# Patient Record
Sex: Male | Born: 1955 | Race: White | Hispanic: No | Marital: Married | Smoking: Former smoker
Health system: Southern US, Community
[De-identification: ages and names within clinical notes are randomized; demographics above are authoritative.]

## PROBLEM LIST (undated history)

## (undated) DIAGNOSIS — Z8619 Personal history of other infectious and parasitic diseases: Secondary | ICD-10-CM

## (undated) DIAGNOSIS — F32A Depression, unspecified: Secondary | ICD-10-CM

## (undated) DIAGNOSIS — F1010 Alcohol abuse, uncomplicated: Secondary | ICD-10-CM

## (undated) DIAGNOSIS — F329 Major depressive disorder, single episode, unspecified: Secondary | ICD-10-CM

## (undated) HISTORY — DX: Personal history of other infectious and parasitic diseases: Z86.19

---

## 2011-12-16 ENCOUNTER — Encounter: Payer: Self-pay | Admitting: Internal Medicine

## 2011-12-16 ENCOUNTER — Ambulatory Visit (INDEPENDENT_AMBULATORY_CARE_PROVIDER_SITE_OTHER): Payer: 59 | Admitting: Internal Medicine

## 2011-12-16 VITALS — BP 122/80 | HR 84 | Temp 98.0°F | Ht 62.0 in | Wt 179.0 lb

## 2011-12-16 DIAGNOSIS — N529 Male erectile dysfunction, unspecified: Secondary | ICD-10-CM | POA: Insufficient documentation

## 2011-12-16 DIAGNOSIS — Z125 Encounter for screening for malignant neoplasm of prostate: Secondary | ICD-10-CM

## 2011-12-16 DIAGNOSIS — R6882 Decreased libido: Secondary | ICD-10-CM

## 2011-12-16 DIAGNOSIS — Z0000 Encounter for general adult medical examination without abnormal findings: Secondary | ICD-10-CM

## 2011-12-16 LAB — BASIC METABOLIC PANEL
BUN: 16 mg/dL (ref 6–23)
Chloride: 106 mEq/L (ref 96–112)
Potassium: 4.9 mEq/L (ref 3.5–5.1)
Sodium: 140 mEq/L (ref 135–145)

## 2011-12-16 LAB — CBC WITH DIFFERENTIAL/PLATELET
Basophils Relative: 0.5 % (ref 0.0–3.0)
Eosinophils Relative: 1.8 % (ref 0.0–5.0)
HCT: 45.3 % (ref 39.0–52.0)
Hemoglobin: 14.8 g/dL (ref 13.0–17.0)
Lymphs Abs: 1.9 10*3/uL (ref 0.7–4.0)
MCV: 86.8 fl (ref 78.0–100.0)
Monocytes Absolute: 0.7 10*3/uL (ref 0.1–1.0)
Monocytes Relative: 10.9 % (ref 3.0–12.0)
Neutro Abs: 3.6 10*3/uL (ref 1.4–7.7)
Platelets: 214 10*3/uL (ref 150.0–400.0)
RBC: 5.22 Mil/uL (ref 4.22–5.81)
WBC: 6.2 10*3/uL (ref 4.5–10.5)

## 2011-12-16 LAB — HEPATIC FUNCTION PANEL
ALT: 28 U/L (ref 0–53)
AST: 27 U/L (ref 0–37)
Albumin: 3.7 g/dL (ref 3.5–5.2)
Alkaline Phosphatase: 50 U/L (ref 39–117)
Total Protein: 6.3 g/dL (ref 6.0–8.3)

## 2011-12-16 LAB — T4, FREE: Free T4: 0.76 ng/dL (ref 0.60–1.60)

## 2011-12-16 LAB — LIPID PANEL: Cholesterol: 198 mg/dL (ref 0–200)

## 2011-12-16 LAB — PSA: PSA: 10.5 ng/mL — ABNORMAL HIGH (ref 0.10–4.00)

## 2011-12-16 LAB — IBC PANEL
Iron: 110 ug/dL (ref 42–165)
Saturation Ratios: 32.7 % (ref 20.0–50.0)
Transferrin: 240 mg/dL (ref 212.0–360.0)

## 2011-12-16 MED ORDER — VARDENAFIL HCL 10 MG PO TABS: 10.0000 mg | ORAL_TABLET | Freq: Every day | ORAL | Status: DC | PRN

## 2011-12-16 NOTE — Assessment & Plan Note (Signed)
Refer for screening colonoscopy. Patient declines influenza vaccine. His rectal exam is normal. Check PSA.

## 2011-12-16 NOTE — Progress Notes (Signed)
Subjective:    Patient ID: Brandon Foster, male    DOB: 06-Nov-1955, 56 y.o.   MRN: [id number]  HPI  56 year old white male to establish. He recently moved to [place] from [place]. He is originally from [place] but transferred to [place] because of his job. He works as a Geographical information systems officer. Patient denies any history of chronic illnesses. However over the last one 2 years he has noticed significant drop in his libido and also noticed issues with sexual function. He was not tested for hypogonadism while he was living in [place]. His previous primary care physician had him try Viagra. Patient reports inconsistent response.  His decrease in libido has been gradually worsening ever since reaching his 9s.  Patient complains he is unable to maintain adequate erection. However he still experiences fairly normal nocturnal erections.  Patient denies history of trauma or infection of his testicles. He denies history of liver disease or family history of hemachromatosis.  He currently does not drink. Patient reports drinking more frequently when he was in his 92s. He is also a previous smoker. He smoked in his teens and then for 3 years from 55 to 2001.  He is divorced.  He has 1 son 29 y/o.  He is in [employer] and currently working in [place].  He has been dating someone for last 2-3 months.  Review of Systems   Constitutional: Negative for activity change, appetite change and unexpected weight change.  Eyes: Negative for visual disturbance.  Respiratory: Negative for cough, chest tightness and shortness of breath.   Cardiovascular: Negative for chest pain.  Genitourinary: Negative for difficulty urinating.  Neurological: Negative for headaches.  Gastrointestinal: Negative for abdominal pain, heartburn melena or hematochezia Psych: Negative for depression or anxiety  Past Medical History  Diagnosis Date  . History of chickenpox     History   Social History  . Marital Status: Divorced   Spouse Name: N/A    Number of Children: N/A  . Years of Education: N/A   Occupational History  . Not on file.   Social History Main Topics  . Smoking status: Former Games developer  . Smokeless tobacco: Not on file  . Alcohol Use: No  . Drug Use: No  . Sexually Active: Not on file   Other Topics Concern  . Not on file   Social History Narrative  . No narrative on file    History reviewed. No pertinent past surgical history.  Family History  Problem Relation Age of Onset  . Hypertension Father   . Diabetes Neg Hx     No Known Allergies  Current Outpatient Prescriptions on File Prior to Visit  Medication Sig Dispense Refill  . vardenafil (LEVITRA) 10 MG tablet Take 1 tablet (10 mg total) by mouth daily as needed for erectile dysfunction.  6 tablet  0    BP 122/80  Pulse 84  Temp 98 F (36.7 C) (Oral)  Ht 5\' 2"  (1.575 m)  Wt 179 lb (81.194 kg)  BMI 32.74 kg/m2           Objective:   Physical Exam  Constitutional: He is oriented to person, place, and time. He appears well-developed and well-nourished.  HENT:  Head: Normocephalic and atraumatic.  Right Ear: External ear normal.  Left Ear: External ear normal.  Mouth/Throat: Oropharynx is clear and moist.  Eyes: Conjunctivae normal and EOM are normal. Pupils are equal, round, and reactive to light. No scleral icterus.       No defects  in peripheral vision  Neck: Normal range of motion. Neck supple. No thyromegaly present.       No carotid bruit  Cardiovascular: Normal rate, regular rhythm, normal heart sounds and intact distal pulses.   No murmur heard. Pulmonary/Chest: Effort normal and breath sounds normal. He has no wheezes.  Abdominal: Soft. Bowel sounds are normal. He exhibits no mass. There is no tenderness.  Genitourinary: Rectum normal, prostate normal and penis normal. Guaiac negative stool.  Musculoskeletal: Normal range of motion. He exhibits no edema.       Slight decrease in muscle mass in upper and  lower extremities  Lymphadenopathy:    He has no cervical adenopathy.  Neurological: He is alert and oriented to person, place, and time. No cranial nerve deficit.  Skin: Skin is warm and dry.  Psychiatric: He has a normal mood and affect. His behavior is normal.          Assessment & Plan:

## 2011-12-16 NOTE — Assessment & Plan Note (Signed)
Testicular exam is normal. Rule out hypogonadism. Obtain testosterone and free testosterone levels.

## 2011-12-16 NOTE — Assessment & Plan Note (Signed)
Patient has tried Viagra in the past with incomplete response. Samples of Levitra 10 mg provided. Patient understands he can self titrate to 20 mg if needed.

## 2011-12-17 LAB — TESTOSTERONE, FREE, TOTAL, SHBG
Testosterone-% Free: 1.5 % — ABNORMAL LOW (ref 1.6–2.9)
Testosterone: 427.75 ng/dL (ref 300–890)

## 2011-12-20 NOTE — Progress Notes (Signed)
Quick Note:  Called and left a message for pt to return call; no pharmacy on file- medication could not be sent. ______

## 2011-12-23 ENCOUNTER — Telehealth: Payer: Self-pay

## 2011-12-23 MED ORDER — CIPROFLOXACIN HCL 500 MG PO TABS: 500.0000 mg | ORAL_TABLET | Freq: Two times a day (BID) | ORAL | Status: DC

## 2011-12-23 NOTE — Telephone Encounter (Signed)
Called pt to advise of lab results.  Pt is aware and states he would like Cipro sent to [facility] in [place].  Rx sent.

## 2011-12-31 ENCOUNTER — Encounter: Payer: Self-pay | Admitting: Internal Medicine

## 2011-12-31 ENCOUNTER — Ambulatory Visit (INDEPENDENT_AMBULATORY_CARE_PROVIDER_SITE_OTHER): Payer: 59 | Admitting: Internal Medicine

## 2011-12-31 VITALS — BP 132/80 | HR 72 | Temp 98.3°F | Wt 176.0 lb

## 2011-12-31 DIAGNOSIS — R972 Elevated prostate specific antigen [PSA]: Secondary | ICD-10-CM

## 2011-12-31 DIAGNOSIS — R6882 Decreased libido: Secondary | ICD-10-CM

## 2011-12-31 MED ORDER — TADALAFIL 5 MG PO TABS: ORAL_TABLET | ORAL | Status: DC

## 2011-12-31 NOTE — Assessment & Plan Note (Signed)
Elevated PSA may be secondary to performing test after digital rectal exam. Also consider mild prostatitis. He is finishing course of Cipro 500 mg twice daily for 10 days. Plan repeating PSA in 3 months.  He is against any further treatment even if he has prostate cancer.  I discussed risks at length with patient.   If persistent PSA elevation, I suggest urology consultation.

## 2011-12-31 NOTE — Patient Instructions (Addendum)
Please complete the following lab tests before your next follow up appointment: PSA - 790.93 Estradiol level - 799.81

## 2011-12-31 NOTE — Assessment & Plan Note (Signed)
56 year old white male with decreased libido for follow up.  His testosterone level is normal. I advised against testosterone replacement especially considering his elevated PSA of unclear etiology. He had decent good response to Levitra but would like to try Cialis. Samples of 5 mg provided.

## 2011-12-31 NOTE — Progress Notes (Signed)
  Subjective:    Patient ID: Brandon Foster, male    DOB: 1955-08-17, 56 y.o.   MRN: [id number]  HPI  56 year old white male with history of erectile dysfunction or low libido for followup. Reviewed blood test results. His testosterone level is normal. Patient had decent response to Levitra 10 mg. He would like to try Cialis.  We also discussed elevated PSA of greater than 10.  PSA level was performed after digital rectal exam. He is not having any symptoms of BPH. He denies family history of prostate cancer. He is currently finishing a ten-day course of Cipro 500 mg twice daily. His previous digital rectal exam was normal.  Review of Systems See HPI  Past Medical History  Diagnosis Date  . History of chickenpox     History   Social History  . Marital Status: Divorced    Spouse Name: N/A    Number of Children: N/A  . Years of Education: N/A   Occupational History  . Not on file.   Social History Main Topics  . Smoking status: Former Games developer  . Smokeless tobacco: Not on file  . Alcohol Use: No  . Drug Use: No  . Sexually Active: Not on file   Other Topics Concern  . Not on file   Social History Narrative  . No narrative on file    No past surgical history on file.  Family History  Problem Relation Age of Onset  . Hypertension Father   . Diabetes Neg Hx     No Known Allergies  Current Outpatient Prescriptions on File Prior to Visit  Medication Sig Dispense Refill  . Multiple Vitamin (MULTIVITAMIN) tablet Take 1 tablet by mouth daily.      . ciprofloxacin (CIPRO) 500 MG tablet Take 1 tablet (500 mg total) by mouth 2 (two) times daily.  20 tablet  0  . tadalafil (CIALIS) 5 MG tablet Take 2 to 4 tablets daily as directed  30 tablet  0    BP 132/80  Pulse 72  Temp 98.3 F (36.8 C) (Oral)  Wt 176 lb (79.833 kg)       Objective:   Physical Exam  Constitutional: He is oriented to person, place, and time. He appears well-developed and well-nourished.    Cardiovascular: Normal rate, regular rhythm and normal heart sounds.   Pulmonary/Chest: Breath sounds normal. He has no wheezes.  Genitourinary: Rectum normal and prostate normal.  Neurological: He is alert and oriented to person, place, and time. No cranial nerve deficit.  Skin: Skin is warm and dry.          Assessment & Plan:

## 2012-01-17 ENCOUNTER — Other Ambulatory Visit: Payer: Self-pay | Admitting: Internal Medicine

## 2012-01-17 ENCOUNTER — Telehealth: Payer: Self-pay | Admitting: Internal Medicine

## 2012-01-17 MED ORDER — TADALAFIL 10 MG PO TABS: ORAL_TABLET | ORAL | Status: DC

## 2012-01-20 MED ORDER — TADALAFIL 10 MG PO TABS: ORAL_TABLET | ORAL | Status: DC

## 2012-01-20 NOTE — Telephone Encounter (Signed)
rx for cialis sent to his pharm.

## 2012-01-20 NOTE — Telephone Encounter (Signed)
Pt aware.

## 2012-01-20 NOTE — Addendum Note (Signed)
Addended by: Alfred Levins D on: 01/20/2012 01:38 PM   Modules accepted: Orders

## 2012-03-25 ENCOUNTER — Other Ambulatory Visit (INDEPENDENT_AMBULATORY_CARE_PROVIDER_SITE_OTHER): Payer: 59

## 2012-03-25 DIAGNOSIS — R972 Elevated prostate specific antigen [PSA]: Secondary | ICD-10-CM

## 2012-03-25 DIAGNOSIS — R6882 Decreased libido: Secondary | ICD-10-CM

## 2012-03-31 ENCOUNTER — Encounter: Payer: Self-pay | Admitting: Internal Medicine

## 2012-04-01 ENCOUNTER — Encounter: Payer: Self-pay | Admitting: Internal Medicine

## 2012-04-01 ENCOUNTER — Ambulatory Visit: Payer: 59 | Admitting: Internal Medicine

## 2012-04-02 ENCOUNTER — Encounter: Payer: Self-pay | Admitting: Internal Medicine

## 2012-05-02 ENCOUNTER — Other Ambulatory Visit: Payer: Self-pay

## 2012-08-25 ENCOUNTER — Ambulatory Visit: Payer: 59 | Admitting: Internal Medicine

## 2012-09-01 ENCOUNTER — Ambulatory Visit: Payer: 59 | Admitting: Internal Medicine

## 2012-09-03 ENCOUNTER — Ambulatory Visit (INDEPENDENT_AMBULATORY_CARE_PROVIDER_SITE_OTHER): Payer: 59 | Admitting: Internal Medicine

## 2012-09-03 ENCOUNTER — Encounter: Payer: Self-pay | Admitting: Internal Medicine

## 2012-09-03 VITALS — BP 124/90 | Temp 98.3°F | Wt 188.0 lb

## 2012-09-03 DIAGNOSIS — R972 Elevated prostate specific antigen [PSA]: Secondary | ICD-10-CM

## 2012-09-03 DIAGNOSIS — F4321 Adjustment disorder with depressed mood: Secondary | ICD-10-CM

## 2012-09-03 DIAGNOSIS — Z23 Encounter for immunization: Secondary | ICD-10-CM

## 2012-09-03 MED ORDER — LAMOTRIGINE 25 MG PO TABS: ORAL_TABLET | ORAL | Status: DC

## 2012-09-03 NOTE — Patient Instructions (Addendum)
Please complete the following lab tests before your next follow up appointment: PSA - 790.93

## 2012-09-03 NOTE — Assessment & Plan Note (Signed)
Patient reports exacerbation of adjustment disorder with depressed mood. He was initially diagnosed in 2009. He had poor response to SSRIs and Wellbutrin. He reports taking Lamictal for 3-6 months. He did not experience any side effects including rash. He understands risks of taking Lamictal.  Trial of low dose 25- 50 mg.  Reassess in 1 month.  He will also consider counseling.

## 2012-09-03 NOTE — Assessment & Plan Note (Signed)
Patient again declines referral to urologist for evaluation of elevated PSA. Discussed possibility of prostate cancer.  He understands risks which may include death.  Continue monitoring PSA.

## 2012-09-03 NOTE — Progress Notes (Signed)
  Subjective:    Patient ID: Brandon Foster, male    DOB: September 22, 1955, 57 y.o.   MRN: [id number]  HPI  57 year old white male complains of depressive symptoms. He has history of adjustment disorder with depressed mood since 2009. He was treated in the past by PCP in [place]. He reports poor response with SSRIs and Wellbutrin. He was tried on Lamictal for 3-6 months with improvement.  His initial depressive symptoms triggered by "nasty" divorce. Patient reports poor sleep quality, irritability, and lack of motivation.  Elevated PSA - Patient still declines referral to urologist for elevated PSA.  Review of Systems He tolerated Lamictal the past without difficulty (no report of rash)    Past Medical History  Diagnosis Date  . History of chickenpox     History   Social History  . Marital Status: Divorced    Spouse Name: N/A    Number of Children: N/A  . Years of Education: N/A   Occupational History  . Not on file.   Social History Main Topics  . Smoking status: Former Games developer  . Smokeless tobacco: Not on file  . Alcohol Use: No  . Drug Use: No  . Sexually Active: Not on file   Other Topics Concern  . Not on file   Social History Narrative  . No narrative on file    No past surgical history on file.  Family History  Problem Relation Age of Onset  . Hypertension Father   . Diabetes Neg Hx     No Known Allergies  Current Outpatient Prescriptions on File Prior to Visit  Medication Sig Dispense Refill  . Multiple Vitamin (MULTIVITAMIN) tablet Take 1 tablet by mouth daily.      . tadalafil (CIALIS) 10 MG tablet Use as directed  90 tablet  1   No current facility-administered medications on file prior to visit.    BP 124/90  Temp(Src) 98.3 F (36.8 C) (Oral)  Wt 188 lb (85.276 kg)  BMI 34.38 kg/m2    Objective:   Physical Exam  Constitutional: He is oriented to person, place, and time. He appears well-developed and well-nourished.  Cardiovascular: Normal rate,  regular rhythm and normal heart sounds.   No murmur heard. Pulmonary/Chest: Effort normal and breath sounds normal. He has no wheezes.  Neurological: He is alert and oriented to person, place, and time. No cranial nerve deficit.  Psychiatric: He has a normal mood and affect. His behavior is normal.          Assessment & Plan:

## 2012-09-28 ENCOUNTER — Other Ambulatory Visit: Payer: 59

## 2012-10-01 ENCOUNTER — Ambulatory Visit: Payer: 59 | Admitting: Internal Medicine

## 2012-12-04 ENCOUNTER — Telehealth: Payer: Self-pay | Admitting: Internal Medicine

## 2012-12-04 NOTE — Telephone Encounter (Signed)
Please clarify message.  What is he requesting?

## 2012-12-04 NOTE — Telephone Encounter (Signed)
Pt called back and stated that he needs 90, not 88. Please assist.

## 2012-12-04 NOTE — Telephone Encounter (Signed)
Ok for 90 day supply of Cialis.  I don't think his ins co will approve 90 pills, but he can have rx with 3 RF

## 2012-12-04 NOTE — Telephone Encounter (Signed)
Patient is requesting a prescription for Cialis 10 mg, #90 -

## 2012-12-04 NOTE — Telephone Encounter (Signed)
Pt request refill of tadalafil (CIALIS) 10 MG tablet But his supplier has changed the packaging and needs 88. (Comes in packs of 4) Pt will pick up script.

## 2012-12-07 MED ORDER — TADALAFIL 10 MG PO TABS: ORAL_TABLET | ORAL | Status: DC

## 2012-12-07 NOTE — Telephone Encounter (Signed)
Rx ready for pick up and patient is aware 

## 2013-01-21 ENCOUNTER — Other Ambulatory Visit: Payer: Self-pay

## 2013-12-17 ENCOUNTER — Telehealth: Payer: Self-pay | Admitting: Internal Medicine

## 2013-12-17 NOTE — Telephone Encounter (Signed)
Pt needs OV 

## 2013-12-17 NOTE — Telephone Encounter (Signed)
Pt request refill of the following: tadalafil (CIALIS) 10 MG tablet    Phamacy:

## 2013-12-20 NOTE — Telephone Encounter (Signed)
Pt will cb to sch °

## 2014-11-15 ENCOUNTER — Encounter: Payer: Self-pay | Admitting: Adult Health

## 2014-11-15 ENCOUNTER — Ambulatory Visit (INDEPENDENT_AMBULATORY_CARE_PROVIDER_SITE_OTHER): Payer: Managed Care, Other (non HMO) | Admitting: Adult Health

## 2014-11-15 VITALS — BP 126/80 | Temp 97.8°F | Ht 62.0 in | Wt 186.0 lb

## 2014-11-15 DIAGNOSIS — Z760 Encounter for issue of repeat prescription: Secondary | ICD-10-CM | POA: Diagnosis not present

## 2014-11-15 NOTE — Progress Notes (Signed)
Pre visit review using our clinic review tool, if applicable. No additional management support is needed unless otherwise documented below in the visit note. 

## 2014-11-15 NOTE — Progress Notes (Signed)
   Subjective:    Patient ID: Brandon Foster, male    DOB: [birth year], 59 y.o.   MRN: [id number]  HPI  59 year old male who presents to the office for refill of his Cialis. He has not had a physical since 2013. His PSA has been elevated in the past and he has always refused Urology referral. He understands the risks. He is unwilling to have a physical, his last one was in 2013. He understands the risks of not having yearly physical as well. He states " I know I am healthy"     Review of Systems  Genitourinary: Negative.        Erectile dysfunction and BPH  All other systems reviewed and are negative.  Past Medical History  Diagnosis Date  . History of chickenpox     Social History   Social History  . Marital Status: Divorced    Spouse Name: N/A  . Number of Children: N/A  . Years of Education: N/A   Occupational History  . Not on file.   Social History Main Topics  . Smoking status: Former Games developer  . Smokeless tobacco: Not on file  . Alcohol Use: No  . Drug Use: No  . Sexual Activity: Not on file   Other Topics Concern  . Not on file   Social History Narrative    No past surgical history on file.  Family History  Problem Relation Age of Onset  . Hypertension Father   . Diabetes Neg Hx     No Known Allergies  Current Outpatient Prescriptions on File Prior to Visit  Medication Sig Dispense Refill  . Multiple Vitamin (MULTIVITAMIN) tablet Take 1 tablet by mouth daily.     No current facility-administered medications on file prior to visit.    BP 126/80 mmHg  Temp(Src) 97.8 F (36.6 C) (Oral)  Ht  (1.575 m)  Wt 186 lb (84.369 kg)  BMI 34.01 kg/m2       Objective:   Physical Exam  Constitutional: He is oriented to person, place, and time. He appears well-developed and well-nourished. No distress.  Neurological: He is alert and oriented to person, place, and time.  Skin: Skin is warm and dry. No rash noted. He is not diaphoretic. No erythema. No  pallor.  Psychiatric: He has a normal mood and affect. His behavior is normal. Judgment and thought content normal.  Nursing note and vitals reviewed.      Assessment & Plan:  1. Medication refill - tadalafil (CIALIS) 10 MG tablet; Use as directed  Dispense: 30 tablet; Refill: 0 - Spoke to pt's PCP ,Dr. Artist Pais. Who advised that he was fine with one month supply, but needs to make an appointment for physical since it has been so long since his last one.   Called patient and informed him that Dr. Artist Pais was ok with one month prescription but that he would have to come in for a physical. Per patient " that is not going to happen. The [facility] pharmacy will only take a 90 day, so don't worry about it. I have a two month supply and I have other resources."

## 2014-11-16 ENCOUNTER — Telehealth: Payer: Self-pay | Admitting: Internal Medicine

## 2014-11-16 ENCOUNTER — Telehealth: Payer: Self-pay | Admitting: Adult Health

## 2014-11-16 MED ORDER — TADALAFIL 10 MG PO TABS: ORAL_TABLET | ORAL | Status: DC

## 2014-11-16 NOTE — Telephone Encounter (Signed)
Pt saw cory yesterday and calling back checking on status of med generic cialis cory was going to talk with dr Artist Pais per pt.

## 2014-11-16 NOTE — Telephone Encounter (Signed)
Spoke to patient on the phone and informed him that Dr. Artist Pais was ok with one month prescription but that he would have to come in for a physical. Per patient "That is not going to happen". He does not want the one month due to cost and needing a 90 day prescription. Per patient " I have a two month supply and I have other resources."

## 2014-11-16 NOTE — Telephone Encounter (Signed)
Left message to call back re: medication refill

## 2016-06-11 ENCOUNTER — Ambulatory Visit (INDEPENDENT_AMBULATORY_CARE_PROVIDER_SITE_OTHER): Payer: Managed Care, Other (non HMO) | Admitting: Physical Medicine and Rehabilitation

## 2016-06-11 ENCOUNTER — Encounter (INDEPENDENT_AMBULATORY_CARE_PROVIDER_SITE_OTHER): Payer: Self-pay | Admitting: Physical Medicine and Rehabilitation

## 2016-06-11 VITALS — BP 151/111 | HR 95

## 2016-06-11 DIAGNOSIS — G8929 Other chronic pain: Secondary | ICD-10-CM

## 2016-06-11 DIAGNOSIS — M542 Cervicalgia: Secondary | ICD-10-CM

## 2016-06-11 DIAGNOSIS — M609 Myositis, unspecified: Secondary | ICD-10-CM

## 2016-06-11 DIAGNOSIS — M545 Low back pain, unspecified: Secondary | ICD-10-CM

## 2016-06-11 DIAGNOSIS — R202 Paresthesia of skin: Secondary | ICD-10-CM | POA: Diagnosis not present

## 2016-06-11 MED ORDER — METHOCARBAMOL 500 MG PO TABS: 500.0000 mg | ORAL_TABLET | Freq: Three times a day (TID) | ORAL | 1 refills | Status: DC | PRN

## 2016-06-11 NOTE — Progress Notes (Signed)
Brandon Foster - 61 y.o. male MRN [id number]  Date of birth: 04/20/1955  Office Visit Note: Visit Date: 06/11/2016 PCP: Brandon Lemonsobert Yoo, DO Referred by: Brandon Foster, Brandon R, DO  Subjective: Chief Complaint  Patient presents with  . Lower Back - Pain  . Neck - Pain   HPI: Mr. Brandon Foster is a pleasant and active 61 year old gentleman who comes in today with history of worsening low back pain starting around December after starting some practice boxing. He tells me that off and on over the years she's been very active in martial arts and physical activities and he's had back pain off and on for sometime early in November and December he began training with someone who was starting to teach him how to box. He says a lot of the practice was on the balls of his feet twisting and turning and jabbing and cooking. He did some work as well the heavy bag with a lot of force. He states that he began having a lot of pain in the lumbosacral junction. He says is probably a little more worse right than left. He began then having some tingling numbness type paresthesias in the right foot more on the bottom of the foot may be the top of the foot. He had no real radiating symptoms down to the foot. He gets some pain in the hip. More on the right but is starting to come on the left. He has had no focal weakness or bowel or bladder symptoms. He's had no fever chills or night sweats. He then began having some neck pain with pain across the shoulders and he'll get radiating symptoms with a paresthesia into the fourth and fifth digit bilaterally. A little bit worse left than right. He states that this neck pain and numbness and tingling actually has been around for many years and just worsened recently. He has never had any workup in terms of imaging such as MRIs or CAT scans. He did have x-rays performed at an urgent care at [facility]. He tells me those were read to him as having some degenerative changes. Unfortunately do not have any  reports or am not able to see the pictures themselves. He states that these were x-rays of the cervical thoracic and lumbar spine.  Again, the symptoms in the feet are intermittent his worst pain as the lower back pain. He has tried Naprosyn taking this 1 over-the-counter Aleve in the morning and at night. He was given Flexeril that made him sleepy but otherwise didn't seem to help that much. He was also given prednisone taper which she didn't take any didn't feel like that helped very much either. He has been seeing a chiropractor for several sessions without much relief. He has seen a chiropractor off and on for years. He has not been to formal physical therapy. He does have a heart history with prior stents. He does not have any other really active medical issues. He does get some worsening in the morning but no frank morning stiffness.    Pain from neck to hips. Pain started after taking boxing lesson about 3 months ago. Neck pain is a little worse on the left. Tingling and numbness in fingers depending on position- bilateral. Also has pain in mid back, across lower back and upper pelvis. Some pain radiating down legs, bilateral buttock and groin pain, numbness in both feet. These symptoms come and go. Seems to get worse after moving around in the morning. [facility]  recently of cervical, thoracic, and lumbar spine. Review of Systems  Constitutional: Negative for chills, fever, malaise/fatigue and weight loss.  HENT: Negative for hearing loss and sinus pain.   Eyes: Negative for blurred vision, double vision and photophobia.  Respiratory: Negative for cough and shortness of breath.   Cardiovascular: Negative for chest pain, palpitations and leg swelling.  Gastrointestinal: Negative for abdominal pain, nausea and vomiting.  Genitourinary: Negative for flank pain.  Musculoskeletal: Positive for back pain and neck pain. Negative for myalgias.  Skin: Negative for itching and rash.    Neurological: Positive for tingling. Negative for tremors, focal weakness and weakness.  Endo/Heme/Allergies: Negative.   Psychiatric/Behavioral: Negative for depression.  All other systems reviewed and are negative.  Otherwise per HPI.  Assessment & Plan: Visit Diagnoses:  1. Chronic bilateral low back pain without sciatica   2. Myofascitis   3. Paresthesia of skin   4. Cervicalgia     Plan: Findings:  Chronic history of off and on low back pain and neck pain with some paresthesia into the fourth and fifth digits bilaterally in the upper extremities. Now with worsening since December of predominantly low back pain after increased activity with learning how to box. He on exam has signs consistent with facet mediated low back pain and trigger points myofascial pain. I am going to have him sign a form so that I can get the x-ray reports from the urgent care. We may ultimately get notes from his chiropractor but not yet. I think at this point the main issue is his low back pain and I think this is a combination of myofascial pain and spondylosis. We are going to send him to physical therapy for a short course a [facility] for manual treatment as well as dry needling. I'm also going increase his Naprosyn which is Aleve over-the-counter to 2 pills twice a day. Will not keep him on that very long. He does have a history of heart stent. I also want to change the muscle relaxer to Robaxin. I think he'll be able to take this during the day without sedation. I'll have him come back in 4 weeks just to see how he is doing. For his neck pain is interesting I think this is again more myofascial. This is been more of a chronic situation and ongoing but nothing that seemingly has limited him. It could be more of a brachial plexus irritation from tight cervical paraspinal musculature as well as tight scalene musculature. Depending on his relief we would look at an MRI of the lumbar spine. I spent  more than 45 minutes speaking face-to-face with the patient with 50% of the time in counseling.    Meds & Orders:  Meds ordered this encounter  Medications  . methocarbamol (ROBAXIN) 500 MG tablet    Sig: Take 1 tablet (500 mg total) by mouth every 8 (eight) hours as needed for muscle spasms.    Dispense:  60 tablet    Refill:  1    Orders Placed This Encounter  Procedures  . Ambulatory referral to Physical Therapy    Follow-up: Return in about 4 weeks (around 07/09/2016).   Procedures: No procedures performed  No notes on file   Clinical History: No specialty comments available.  He reports that he has quit smoking. He has never used smokeless tobacco. No results for input(s): HGBA1C, LABURIC in the last 8760 hours.  Objective:  VS:  HT:    WT:   BMI:  BP:(!) 151/111  HR:95bpm  TEMP: ( )  RESP:  Physical Exam  Constitutional: He is oriented to person, place, and time. He appears well-developed and well-nourished. No distress.  HENT:  Head: Normocephalic and atraumatic.  Nose: Nose normal.  Mouth/Throat: Oropharynx is clear and moist.  Eyes: Conjunctivae are normal. Pupils are equal, round, and reactive to light.  Neck: Normal range of motion. Neck supple. No tracheal deviation present.  Cardiovascular: Regular rhythm and intact distal pulses.   Pulmonary/Chest: Effort normal and breath sounds normal.  Abdominal: Soft. He exhibits no distension.  Musculoskeletal: He exhibits no deformity.  Cervical range of motion is limited in ranges of rotation and just due to some pain. He does have some mild trigger points in the levator scapula and rhomboid. He has mild shoulder impingement signs bilaterally. He has a negative drop arm test bilaterally. Inspection reveals no atrophy of the bilateral APB or FDI or hand intrinsics. There is no swelling, color changes, allodynia or dystrophic changes. There is 5 out of 5 strength in the bilateral wrist extension, finger abduction and  long finger flexion. There is intact sensation to light touch in all dermatomal and peripheral nerve distributions.  There is a negative Tinel's test at the bilateral wrist and elbow. There is a negative Phalen's test bilaterally. There is a negative Hoffmann's test bilaterally.  The patient ambulates without aid with a normal gait.  They have no pain with hip internal or external rotation.  There is concordant pain with extension rotation of the lumbar spine. They have no pain over the greater trochanters or PSIS.  On manual muscle testing there is 5/5 strength in all muscle groups of the lower extremities bilaterally without deficits.  There are 2+ muscle stretch reflexes at the quadriceps and gastrocnemius.  There is no clonus bilaterally.  Slump test is negative bilaterally. There are focal trigger points in the paraspinal musculature at the lumbosacral junction. These reproduces pain as well.   Lymphadenopathy:    He has no cervical adenopathy.  Neurological: He is alert and oriented to person, place, and time.  Skin: Skin is warm. No rash noted.  Psychiatric: He has a normal mood and affect. His behavior is normal.  Nursing note and vitals reviewed.   Ortho Exam Imaging: No results found.  Past Medical/Family/Surgical/Social History: Medications & Allergies reviewed per EMR Patient Active Problem List   Diagnosis Date Noted  . Adjustment disorder with depressed mood 09/03/2012  . Elevated PSA 12/31/2011  . Erectile dysfunction 12/16/2011  . Libido, decreased 12/16/2011  . Preventative health care 12/16/2011   Past Medical History:  Diagnosis Date  . History of chickenpox    Family History  Problem Relation Age of Onset  . Hypertension Father   . Diabetes Neg Hx    History reviewed. No pertinent surgical history. Social History   Occupational History  . Not on file.   Social History Main Topics  . Smoking status: Former Games developer  . Smokeless tobacco: Never Used  .  Alcohol use No  . Drug use: No  . Sexual activity: Not on file

## 2016-06-11 NOTE — Patient Instructions (Signed)
Aleve 2 tabs by mouth 2 times per day until we see you back.

## 2016-07-09 ENCOUNTER — Ambulatory Visit (INDEPENDENT_AMBULATORY_CARE_PROVIDER_SITE_OTHER): Payer: Managed Care, Other (non HMO) | Admitting: Physical Medicine and Rehabilitation

## 2016-07-12 ENCOUNTER — Telehealth (INDEPENDENT_AMBULATORY_CARE_PROVIDER_SITE_OTHER): Payer: Self-pay

## 2016-07-12 NOTE — Telephone Encounter (Signed)
Patient left vm wishing to canc his appt for 07/16/16 due to having to go out of town for work. Stated he will call back to rs.

## 2016-07-16 ENCOUNTER — Ambulatory Visit (INDEPENDENT_AMBULATORY_CARE_PROVIDER_SITE_OTHER): Payer: Managed Care, Other (non HMO) | Admitting: Physical Medicine and Rehabilitation

## 2016-07-25 ENCOUNTER — Ambulatory Visit (INDEPENDENT_AMBULATORY_CARE_PROVIDER_SITE_OTHER): Payer: Managed Care, Other (non HMO) | Admitting: Physical Medicine and Rehabilitation

## 2016-07-25 ENCOUNTER — Encounter (INDEPENDENT_AMBULATORY_CARE_PROVIDER_SITE_OTHER): Payer: Self-pay | Admitting: Physical Medicine and Rehabilitation

## 2016-07-25 VITALS — BP 143/96 | HR 89

## 2016-07-25 DIAGNOSIS — M5116 Intervertebral disc disorders with radiculopathy, lumbar region: Secondary | ICD-10-CM

## 2016-07-25 DIAGNOSIS — M609 Myositis, unspecified: Secondary | ICD-10-CM | POA: Diagnosis not present

## 2016-07-25 DIAGNOSIS — M542 Cervicalgia: Secondary | ICD-10-CM | POA: Diagnosis not present

## 2016-07-25 DIAGNOSIS — G8929 Other chronic pain: Secondary | ICD-10-CM

## 2016-07-25 DIAGNOSIS — M545 Low back pain: Secondary | ICD-10-CM

## 2016-07-25 NOTE — Progress Notes (Deleted)
Has been going to PT and doing core strengthening exercises. Had dry needling which did help some with right lower back muscle tightness. Other than that hasn't noticed much change with pain and reports that it is still lingering. Couldn't tell much difference with muscle relaxer either and has stopped taking. Stil has flare ups of neck pain also. Right now he reports tightness on left side of neck.

## 2016-07-25 NOTE — Progress Notes (Signed)
Brandon Foster - 61 y.o. male MRN [id number]  Date of birth: Sep 13, 1955  Office Visit Note: Visit Date: 07/25/2016 PCP: Patient, No Pcp Per Referred by: Artist PaisYoo, Doe-Hyun R, DO  Subjective: Chief Complaint  Patient presents with  . Lower Back - Pain   HPI: Brandon Foster is a 61 year old gentleman that we saw the end of March with a several month history of chronic worsening low back pain as well as neck pain. He has been to physical therapy for core strengthening and posture work as well as dry needling. He says it did help some with his right lower back and muscle tightness. He stated that the work on the quadratus lumborum help the most. He said he other than that he hasn't really noticed that much in terms of pain relief in his pain is still lingering. He also couldn't tell much with the muscle relaxers always quit taking that. He still has flareups of his neck pain but is not nearly as bad as his low back. He has tightness of the left side of the neck. A lot of his pain complaints are not sharp shooting pain but more lingering type pain. He has had some episodic paresthesias into the hips and legs. He is still just somewhat concerned with his back in general. He has not had any focal weakness. He still exercising. He did have one episode where he was engaging in some sexual activity and has significant back spasms in his back locked up which was very scary for him. He has had again no focal weakness or focal night pain. He's had no unexplained weight loss. Symptoms are better at rest but they linger.    Review of Systems  Constitutional: Negative for chills, fever, malaise/fatigue and weight loss.  HENT: Negative for hearing loss and sinus pain.   Eyes: Negative for blurred vision, double vision and photophobia.  Respiratory: Negative for cough and shortness of breath.   Cardiovascular: Negative for chest pain, palpitations and leg swelling.  Gastrointestinal: Negative for abdominal pain, nausea and  vomiting.  Genitourinary: Negative for flank pain.  Musculoskeletal: Positive for back pain and neck pain. Negative for myalgias.  Skin: Negative for itching and rash.  Neurological: Negative for tremors, focal weakness and weakness.  Endo/Heme/Allergies: Negative.   Psychiatric/Behavioral: Negative for depression.  All other systems reviewed and are negative.  Otherwise per HPI.  Assessment & Plan: Visit Diagnoses:  1. Chronic bilateral low back pain without sciatica   2. Myofascitis   3. Cervicalgia   4. Radiculopathy due to lumbar intervertebral disc disorder     Plan: Findings:  At 1 month follow-up from initial consultation he is still having low back pain is worrisome for him with some referring pattern and some paresthesia although it's really nondermatomal. He has had episodes where it's severe but just mostly lingering pain at times. He is still concerned about his low back is been going on for some time. I do think it would be wise at this point to obtain an MRI of the lumbar spine. He has felt conservative care including medication management and physical therapy and rest and time. He's had no prior back surgery or spinal interventions. His neck pain is still more myofascial. I don't think they worked with him as well on the neck as the low back and therapy and that something I think we need to regroup with at some point. Right now her to look at an MRI of his lumbar spine having back  to review that. Otherwise he should continue his activities and she's been doing. We talked about activity modification he's gotten a good idea that from physical therapy as well. I also will refer him to a massage therapist, Fabiola Backer.    Meds & Orders: No orders of the defined types were placed in this encounter.   Orders Placed This Encounter  Procedures  . MR LUMBAR SPINE WO CONTRAST    Follow-up: Return for MRI review after completion.   Procedures: No procedures performed  No notes on  file   Clinical History: No specialty comments available.  He reports that he has quit smoking. He has never used smokeless tobacco. No results for input(s): HGBA1C, LABURIC in the last 8760 hours.  Objective:  VS:  HT:    WT:   BMI:     BP:(!) 143/96  HR:89bpm  TEMP: ( )  RESP:  Physical Exam  Constitutional: He is oriented to person, place, and time. He appears well-developed and well-nourished. No distress.  HENT:  Head: Normocephalic and atraumatic.  Eyes: Conjunctivae are normal. Pupils are equal, round, and reactive to light.  Neck: Neck supple.  Cardiovascular: Regular rhythm and intact distal pulses.   Pulmonary/Chest: Effort normal. No respiratory distress.  Musculoskeletal:  Cervical range of motion is limited in ranges of extension rotation and forward flexion just due to pain. He has a negative Spurling's test bilaterally. He still has very tight musculature in the trapezius and rhomboid and infraspinatus on the left more than right. He has no shoulder impingement signs. Lumbar spine reviewed shows pain with extension rotation. He has good distal strength and no pain over the PSIS and no pain with hip rotation is no clonus.  Lymphadenopathy:    He has no cervical adenopathy.  Neurological: He is alert and oriented to person, place, and time.  Skin: Skin is warm and dry. No rash noted. No erythema.  Psychiatric: He has a normal mood and affect.  Nursing note and vitals reviewed.   Ortho Exam Imaging: No results found.  Past Medical/Family/Surgical/Social History: Medications & Allergies reviewed per EMR Patient Active Problem List   Diagnosis Date Noted  . Adjustment disorder with depressed mood 09/03/2012  . Elevated PSA 12/31/2011  . Erectile dysfunction 12/16/2011  . Libido, decreased 12/16/2011  . Preventative health care 12/16/2011   Past Medical History:  Diagnosis Date  . History of chickenpox    Family History  Problem Relation Age of Onset  .  Hypertension Father   . Diabetes Neg Hx    History reviewed. No pertinent surgical history. Social History   Occupational History  . Not on file.   Social History Main Topics  . Smoking status: Former Games developer  . Smokeless tobacco: Never Used  . Alcohol use No  . Drug use: No  . Sexual activity: Not on file

## 2016-07-29 ENCOUNTER — Encounter (INDEPENDENT_AMBULATORY_CARE_PROVIDER_SITE_OTHER): Payer: Self-pay | Admitting: Physical Medicine and Rehabilitation

## 2016-08-07 ENCOUNTER — Ambulatory Visit
Admission: RE | Admit: 2016-08-07 | Discharge: 2016-08-07 | Disposition: A | Discharge status: Home / Self Care | Payer: Managed Care, Other (non HMO) | Source: Ambulatory Visit | Attending: Physical Medicine and Rehabilitation | Admitting: Physical Medicine and Rehabilitation

## 2016-08-07 DIAGNOSIS — M5116 Intervertebral disc disorders with radiculopathy, lumbar region: Secondary | ICD-10-CM

## 2016-08-07 DIAGNOSIS — M545 Low back pain: Principal | ICD-10-CM

## 2016-08-07 DIAGNOSIS — G8929 Other chronic pain: Secondary | ICD-10-CM

## 2016-08-08 ENCOUNTER — Encounter (INDEPENDENT_AMBULATORY_CARE_PROVIDER_SITE_OTHER): Payer: Self-pay | Admitting: Physical Medicine and Rehabilitation

## 2016-08-08 ENCOUNTER — Telehealth (INDEPENDENT_AMBULATORY_CARE_PROVIDER_SITE_OTHER): Payer: Self-pay | Admitting: Physical Medicine and Rehabilitation

## 2016-08-08 ENCOUNTER — Ambulatory Visit (INDEPENDENT_AMBULATORY_CARE_PROVIDER_SITE_OTHER): Payer: Managed Care, Other (non HMO) | Admitting: Physical Medicine and Rehabilitation

## 2016-08-08 VITALS — BP 152/111 | HR 100

## 2016-08-08 DIAGNOSIS — G8929 Other chronic pain: Secondary | ICD-10-CM

## 2016-08-08 DIAGNOSIS — M545 Low back pain: Secondary | ICD-10-CM | POA: Diagnosis not present

## 2016-08-08 DIAGNOSIS — M609 Myositis, unspecified: Secondary | ICD-10-CM | POA: Diagnosis not present

## 2016-08-08 NOTE — Progress Notes (Deleted)
Here for MRI review. Reports that the back pain seems to be a little better. Pain is on both sides of lower back. It radiates if under physical or mental stress.

## 2016-08-08 NOTE — Progress Notes (Signed)
Brandon Foster - 61 y.o. male MRN [id number]  Date of birth: 1955/03/24  Office Visit Note: Visit Date: 08/08/2016 PCP: Patient, No Pcp Per Referred by: No ref. provider found  Subjective: Chief Complaint  Patient presents with  . Lower Back - Pain   HPI: Brandon Foster is a 61 year old gentleman that we recently saw for worsening low back pain which can be quite dramatic at times a muscle spasm quality when severe. It does neck him most of the time and is fairly constant. Is worse with increased stress and increased activity. It is mostly worse with standing but can be present standing or sitting. He has no radicular complaints down the legs. No focal weakness. No specific trauma. He's been a pretty physical active guy otherwise. We did obtain an MRI of the lumbar spine which is reviewed below but is essentially normal in this age. There is some degenerative disc changes and some facet arthropathy at L3-4. None of this seems to really fit overall with the severe pain syndrome. He has not had any interventional procedures. I'm a little worried about his high blood pressure that he only seems to have when he comes in. He does not have a primary care physician. He states he keeps his blood pressure at home and is not usually that this may be whitecoat syndrome. He has no chest pain or other symptoms of high blood pressure. He has had physical therapy and now has seen a massage therapist who suggested once a month massage therapy. 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    Review of Systems  Constitutional: Negative for chills, fever, malaise/fatigue and weight loss.  HENT: Negative for hearing loss and sinus pain.   Eyes: Negative for blurred vision, double vision and photophobia.  Respiratory: Negative for cough and shortness of breath.   Cardiovascular: Negative for chest pain, palpitations and leg swelling.  Gastrointestinal: Negative for abdominal pain, nausea and vomiting.  Genitourinary:  Negative for flank pain.  Musculoskeletal: Positive for back pain. Negative for myalgias.  Skin: Negative for itching and rash.  Neurological: Negative for tremors, focal weakness and weakness.  Endo/Heme/Allergies: Negative.   Psychiatric/Behavioral: Negative for depression.  All other systems reviewed and are negative.  Otherwise per HPI.  Assessment & Plan: Visit Diagnoses:  1. Chronic bilateral low back pain without sciatica   2. Myofascitis     Plan: Findings:  Chronic worsening somewhat episodic low back pain with fairly normal appearing MRI for his age with some mild facet arthropathy but with no significant stenosis or focal disc herniations. I don't think his symptoms are facet mediated is by the way he describes the pain in physical exam findings. I do think at this point an epidural injection would be warranted just to see if from a standpoint of his degenerative disc changes is any issue with the disc related annular tear or something similar. He has really failed conservative care including physical therapy as well as massage therapy and medication management. I also have concerns about his hypertension. I think one avenue we can help him with his try to get him a referral to Dr. Gaspar BiddingMichael Rigby. I think Dr. Berline Choughigby could potentially look at osteopathic manipulation but also just further evaluation of his back pain. MRI did show some atrophy of the multifidus I think Dr. Berline Choughigby could probably helping work on that as well. We did talk about exercises today including "bird dog and Supermans".  I will also like Dr. Berline Choughigby  to look at his hypertension and whether he is amenable to seeing him his primary care doctor he can at least rectum in that arena. We will seek approval for diagnostic lumbar epidural injection at L4-5. If he is not helped by that injection that I'm not sure I have much left for him except continued conservative care. I spent more than 20 minutes speaking face-to-face with the  patient with 50% of the time in counseling.    Meds & Orders: No orders of the defined types were placed in this encounter.   Orders Placed This Encounter  Procedures  . AMB referral to sports medicine    Follow-up: Return for L4-5 interlaminar epidural injection..   Procedures: No procedures performed  No notes on file   Clinical History: MRI LUMBAR SPINE WITHOUT CONTRAST 08/07/2016 0  COMPARISON:  None.  FINDINGS: Segmentation:  Standard.  Alignment:  Physiologic.  Vertebrae:  No fracture, evidence of discitis, or bone lesion.  Conus medullaris: Extends to the L1 level and appears normal.  Paraspinal and other soft tissues: Unremarkable.  Disc levels:  L1-L2: Moderate disc space narrowing. Central protrusion. No impingement.  L2-L3: Preserved interspace height. Slight desiccation. Annular bulge. Facet arthropathy. No impingement.  L3-L4: Mild disc space narrowing. Shallow central and leftward protrusion. Posterior element hypertrophy. Subarticular zone narrowing could affect the LEFT L4 nerve root.  L4-L5: Mild disc space narrowing. Central protrusion. Facet arthropathy. No definite impingement.  L5-S1: Preserved interspace height. Slight desiccation. Central protrusion. No impingement.  IMPRESSION: Mild multilevel spondylosis. No significant spinal stenosis or dominant disc extrusion.  He reports that he has quit smoking. He has never used smokeless tobacco. No results for input(s): HGBA1C, LABURIC in the last 8760 hours.  Objective:  VS:  HT:    WT:   BMI:     BP:(!) 152/111  HR:100bpm  TEMP: ( )  RESP:  Physical Exam  Constitutional: He is oriented to person, place, and time. He appears well-developed and well-nourished. No distress.  HENT:  Head: Normocephalic and atraumatic.  Eyes: Conjunctivae are normal. Pupils are equal, round, and reactive to light.  Neck: Normal range of motion. Neck supple.  Cardiovascular: Regular rhythm and  intact distal pulses.   Pulmonary/Chest: Effort normal. No respiratory distress.  Musculoskeletal:  Patient ambulates without aid with a slightly forward flexed spine. He doesn't have temporal pain with extension rotation although he is tight. He does have paraspinal tenderness and tenderness in the quadratus lumborum bilaterally. He has no pain over the greater trochanters. He has no pain with hip rotation. Has good distal strength bilaterally. He has no clonus.  Neurological: He is alert and oriented to person, place, and time. He exhibits normal muscle tone. Coordination normal.  Skin: Skin is warm and dry. No rash noted. No erythema.  Psychiatric: He has a normal mood and affect.  Nursing note and vitals reviewed.   Ortho Exam Imaging: No results found.  Past Medical/Family/Surgical/Social History: Medications & Allergies reviewed per EMR Patient Active Problem List   Diagnosis Date Noted  . Adjustment disorder with depressed mood 09/03/2012  . Elevated PSA 12/31/2011  . Erectile dysfunction 12/16/2011  . Libido, decreased 12/16/2011  . Preventative health care 12/16/2011   Past Medical History:  Diagnosis Date  . History of chickenpox    Family History  Problem Relation Age of Onset  . Hypertension Father   . Diabetes Neg Hx    History reviewed. No pertinent surgical history. Social History   Occupational History  .  Not on file.   Social History Main Topics  . Smoking status: Former Games developer  . Smokeless tobacco: Never Used  . Alcohol use No  . Drug use: No  . Sexual activity: Not on file

## 2016-08-08 NOTE — Telephone Encounter (Signed)
Submitted auth request on [employer] website and it is pending review

## 2016-08-08 NOTE — Telephone Encounter (Signed)
This meant for you?

## 2016-08-13 NOTE — Telephone Encounter (Signed)
Received auth. [id number]. eff 08/26/16-11/24/16. Pt already scheduled.

## 2016-08-26 ENCOUNTER — Ambulatory Visit (INDEPENDENT_AMBULATORY_CARE_PROVIDER_SITE_OTHER): Payer: Self-pay

## 2016-08-26 ENCOUNTER — Encounter (INDEPENDENT_AMBULATORY_CARE_PROVIDER_SITE_OTHER): Payer: Self-pay | Admitting: Physical Medicine and Rehabilitation

## 2016-08-26 ENCOUNTER — Ambulatory Visit (INDEPENDENT_AMBULATORY_CARE_PROVIDER_SITE_OTHER): Payer: Managed Care, Other (non HMO) | Admitting: Physical Medicine and Rehabilitation

## 2016-08-26 VITALS — BP 161/106 | HR 103

## 2016-08-26 DIAGNOSIS — M5116 Intervertebral disc disorders with radiculopathy, lumbar region: Secondary | ICD-10-CM

## 2016-08-26 MED ORDER — LIDOCAINE HCL (PF) 1 % IJ SOLN
2.0000 mL | Freq: Once | INTRAMUSCULAR | Status: AC
  Administered 2016-08-26: 2 mL

## 2016-08-26 MED ORDER — METHYLPREDNISOLONE ACETATE 80 MG/ML IJ SUSP
80.0000 mg | Freq: Once | INTRAMUSCULAR | Status: AC
  Administered 2016-08-26: 80 mg

## 2016-08-26 NOTE — Progress Notes (Deleted)
Patient is here today for planned L4-5 interlaminar injection. No change in symptoms.

## 2016-08-26 NOTE — Patient Instructions (Signed)

## 2016-08-26 NOTE — Procedures (Signed)
Lumbar Epidural Steroid Injection - Interlaminar Approach with Fluoroscopic Guidance  Patient: Brandon Foster      Date of Birth: 1955/09/30 MRN: [id number] PCP: Patient, No Pcp Per      Visit Date: 08/26/2016   Brandon Foster is a 61 year old active gentleman with chronic worsening low back pain which has failed conservative care including chiropractic care and physical therapy as well as anti-inflammatories and muscle relaxers. MRI shows very mild changes of the lumbar spine typical for his age. There is some mild bulging. We are going to complete a diagnostic of therapeutic L4-5 intralaminar epidural steroid injection just to see if he'll give him some relief. He is scheduled to see Dr. Berline Choughigby for evaluation and management as well. Hopefully that may be OMT treatment may help. Otherwise not much more to offer the patient continued ability to stay active and look for ways to improve his core strengthening. He also reports today with vital signs showing increased blood pressure. He tells me that his blood pressure is fine at home and only increases when he comes in to the office. Dr. Berline Choughigby look into this as well.  Physical exam is nonfocal and there is no weakness.  Universal Protocol:    Date/Time: 06/11/183:48 PM  Consent Given By: the patient  Position: PRONE  Additional Comments: Vital signs were monitored before and after the procedure. Patient was prepped and draped in the usual sterile fashion. The correct patient, procedure, and site was verified.   Injection Procedure Details:  Procedure Site One Meds Administered:  Meds ordered this encounter  Medications  . lidocaine (PF) (XYLOCAINE) 1 % injection 2 mL  . methylPREDNISolone acetate (DEPO-MEDROL) injection 80 mg     Laterality: Right  Location/Site:  L4-L5  Needle size: 20 G  Needle type: Tuohy  Needle Placement: Paramedian epidural  Findings:  -Contrast Used: 1 mL iohexol 180 mg iodine/mL   -Comments: Excellent flow of  contrast into the epidural space.  Procedure Details: Using a paramedian approach from the side mentioned above, the region overlying the inferior lamina was localized under fluoroscopic visualization and the soft tissues overlying this structure were infiltrated with 4 ml. of 1% Lidocaine without Epinephrine. The Tuohy needle was inserted into the epidural space using a paramedian approach.   The epidural space was localized using loss of resistance along with lateral and bi-planar fluoroscopic views.  After negative aspirate for air, blood, and CSF, a 2 ml. volume of Isovue-250 was injected into the epidural space and the flow of contrast was observed. Radiographs were obtained for documentation purposes.    The injectate was administered into the level noted above.   Additional Comments:  The patient tolerated the procedure well Dressing: Band-Aid    Post-procedure details: Patient was observed during the procedure. Post-procedure instructions were reviewed.  Patient left the clinic in stable condition.

## 2016-09-02 ENCOUNTER — Ambulatory Visit (INDEPENDENT_AMBULATORY_CARE_PROVIDER_SITE_OTHER): Payer: Managed Care, Other (non HMO) | Admitting: Sports Medicine

## 2016-09-02 ENCOUNTER — Encounter: Payer: Self-pay | Admitting: Sports Medicine

## 2016-09-02 VITALS — BP 138/88 | HR 100 | Ht 73.0 in | Wt 186.4 lb

## 2016-09-02 DIAGNOSIS — M5441 Lumbago with sciatica, right side: Secondary | ICD-10-CM | POA: Diagnosis not present

## 2016-09-02 DIAGNOSIS — M5442 Lumbago with sciatica, left side: Secondary | ICD-10-CM | POA: Diagnosis not present

## 2016-09-02 DIAGNOSIS — M9905 Segmental and somatic dysfunction of pelvic region: Secondary | ICD-10-CM

## 2016-09-02 DIAGNOSIS — R030 Elevated blood-pressure reading, without diagnosis of hypertension: Secondary | ICD-10-CM | POA: Diagnosis not present

## 2016-09-02 MED ORDER — GABAPENTIN 400 MG PO CAPS: ORAL_CAPSULE | ORAL | 2 refills | Status: DC

## 2016-09-02 NOTE — Progress Notes (Signed)
OFFICE VISIT NOTE Brandon Foster. [facility] at [facility] [phone]  Linsey Hirota - 61 y.o. male MRN [id number]  Date of birth: [birth year]  Visit Date: 09/02/2016  PCP: Patient, No Pcp Per   Referred by: Tyrell Antonio, MD  Orlie Dakin, CMA acting as scribe for Dr. Berline Chough.  SUBJECTIVE:   Chief Complaint  Patient presents with  . chronic bilateral low back pain without sciatica   HPI: As below and per problem based documentation when appropriate.  Pt presents today with complaint of chronic bilateral low back pain with sciatica. Pt was referred by Dr. Alvester Morin. He had MRI 08/07/16 which showed the following: IMPRESSION: Mild multilevel spondylosis. No significant spinal stenosis or dominant disc extrusion. Pt also has mid back and neck pain.  Pt was taking a boxing class and suspects this is related to his neck and mid back pain.   The pain is described as aching and is rated as 5/10 on average. The pain is constant but varies in severity.   Worsened with increased stress and increased activity. Pain is also worse when standing.  Improves with laying down on back, side or stomach.  Therapies tried include : Flexeril, Robaxin. He got more relief from Flexeril than Robaxin but the Flexeril affected his productivity at work.   Other associated symptoms include: Pt has shooting tingling pain in his buttock and down into both legs and at times his toes go numb.   Pt denies fever, chills, night sweats, unintentional weight gain or loss.     Review of Systems  Constitutional: Negative for chills and fever.  Respiratory: Negative for shortness of breath and wheezing.   Cardiovascular: Negative for chest pain, palpitations and leg swelling.  Musculoskeletal: Positive for back pain. Negative for falls.  Neurological: Positive for tingling and headaches. Negative for dizziness.  Endo/Heme/Allergies: Does not bruise/bleed easily.   Otherwise per HPI.  HISTORY & PERTINENT PRIOR DATA:  No specialty comments available. He reports that he has quit smoking. He has never used smokeless tobacco. No results for input(s): HGBA1C, LABURIC in the last 8760 hours. Medications & Allergies reviewed per EMR Patient Active Problem List   Diagnosis Date Noted  . Elevated blood pressure reading without diagnosis of hypertension 09/15/2016  . Acute bilateral low back pain with bilateral sciatica 09/02/2016  . Adjustment disorder with depressed mood 09/03/2012  . Elevated PSA 12/31/2011  . Erectile dysfunction 12/16/2011  . Libido, decreased 12/16/2011  . Preventative health care 12/16/2011   Past Medical History:  Diagnosis Date  . History of chickenpox    Family History  Problem Relation Age of Onset  . Hypertension Father   . Diabetes Neg Hx    No past surgical history on file. Social History   Occupational History  . Not on file.   Social History Main Topics  . Smoking status: Former Games developer  . Smokeless tobacco: Never Used  . Alcohol use No  . Drug use: No  . Sexual activity: Not on file    OBJECTIVE:  VS:  HT:6\' 1"  (185.4 cm)   WT:186 lb 6.4 oz (84.6 kg)  BMI:24.6    BP:138/88  HR:100bpm  TEMP: ( )  RESP:97 % EXAM: Findings:  WDWN, NAD, Non-toxic appearing Alert & appropriately interactive Not depressed or anxious appearing No increased work of breathing. Pupils are equal. EOM intact without nystagmus No clubbing or cyanosis of the extremities appreciated No significant rashes/lesions/ulcerations overlying the examined area. Radial  pulses 2+/4.  No significant generalized UE edema. DP & PT pulses 2+/4.  No significant pretibial edema.  No clubbing or cyanosis Sensation intact to light touch in upper and lower extremities.  HEART: RRR, S1/S2 heard, no murmur LUNGS: CTA B  Back & Lower Extremities: Markedly tight straight leg raises but no radicular component. No significant midline tenderness.     Generalized paraspinal muscle tenderness and SI joint TTP without radiation.  Mild pain in the gluteal muscles but no pain no greater sciatic notch. Good internal and external rotation of the hips. Patient is able to heel and toe walk without significant difficulty.  Manual muscle testing is 5+/5 in BLE myotomes without focality Lower extremity DTRs 2+/4 diffusely and symmetric  OSTEOPATHIC/STRUCTURAL EXAM:   Right anterior innominate Markedly tight right so as with overall good lumbar mobility      Xr C-arm No Report  Result Date: 08/26/2016 Please see Notes or Procedures tab for imaging impression.  ASSESSMENT & PLAN:   Problem List Items Addressed This Visit    Acute bilateral low back pain with bilateral sciatica - Primary    PROCEDURE NOTE : OSTEOPATHIC MANIPULATION The decision today to treat with Osteopathic Manipulative Therapy (OMT) was based on physical exam findings. Verbal consent was obtained after after explanation of risks, benefits and potential side effects, including acute pain flare, post manipulation soreness and need for repeat treatments.  If Cervical manipulation was performed additional time was spent discussing the associated minimal risk of  injury to neurovascular structures.  After consent was obtained manipulation was performed as below:            Regions treated:  Per billing codes          Techniques used:  Direct, Muscle Energy, MFR The patient tolerated the treatment well and reported Improved symptoms following treatment today. Patient was given medications, exercises, stretches and lifestyle modifications per AVS and verbally.    HEP including "Foundations Training" Right so as is remarkably tight the point that he was only able to get to approximately 90 hip flexion position prior to engaging a physiologic restriction.  He had to be laid in a lateral decubitus position to be able to adequately stretch this because in a supine position he was already  guarding so much. He has good lumbar mobility which is what is contributing to this being so difficult to ascertain.      Elevated blood pressure reading without diagnosis of hypertension    Patient reports normal blood pressures at home.  Elevated multiple times while seeing Dr. Alvester MorinNewton for back pain and epidural steroid injections.  Likely whitecoat hypertension.  We will have him back home blood pressures several times of the next 2 weeks and will follow-up with him at that time.       Other Visit Diagnoses    Somatic dysfunction of pelvis region          Follow-up: Return in about 2 weeks (around 09/16/2016).   CMA/ATC served as Neurosurgeonscribe during this visit. History, Physical, and Plan performed by medical provider. Documentation and orders reviewed and attested to.      Gaspar BiddingMichael Rigby, DO    [facility]

## 2016-09-02 NOTE — Patient Instructions (Signed)
Also check out the YouTube Video from Dr. Eric Goodman.   "Powerful Posture and Pain Relief: 12 minutes of Foundation Training" - [email or web address].[email or web address]   

## 2016-09-15 DIAGNOSIS — R030 Elevated blood-pressure reading, without diagnosis of hypertension: Secondary | ICD-10-CM | POA: Insufficient documentation

## 2016-09-15 NOTE — Assessment & Plan Note (Addendum)
PROCEDURE NOTE : OSTEOPATHIC MANIPULATION The decision today to treat with Osteopathic Manipulative Therapy (OMT) was based on physical exam findings. Verbal consent was obtained after after explanation of risks, benefits and potential side effects, including acute pain flare, post manipulation soreness and need for repeat treatments.  If Cervical manipulation was performed additional time was spent discussing the associated minimal risk of  injury to neurovascular structures.  After consent was obtained manipulation was performed as below:            Regions treated:  Per billing codes          Techniques used:  Direct, Muscle Energy, MFR The patient tolerated the treatment well and reported Improved symptoms following treatment today. Patient was given medications, exercises, stretches and lifestyle modifications per AVS and verbally.    HEP including "Foundations Training" Right so as is remarkably tight the point that he was only able to get to approximately 90 hip flexion position prior to engaging a physiologic restriction.  He had to be laid in a lateral decubitus position to be able to adequately stretch this because in a supine position he was already guarding so much. He has good lumbar mobility which is what is contributing to this being so difficult to ascertain.

## 2016-09-15 NOTE — Assessment & Plan Note (Signed)
Patient reports normal blood pressures at home.  Elevated multiple times while seeing Dr. Alvester MorinNewton for back pain and epidural steroid injections.  Likely whitecoat hypertension.  We will have Brandon Foster back home blood pressures several times of the next 2 weeks and will follow-up with Brandon Foster at that time.

## 2016-09-16 ENCOUNTER — Ambulatory Visit (INDEPENDENT_AMBULATORY_CARE_PROVIDER_SITE_OTHER): Payer: Managed Care, Other (non HMO) | Admitting: Sports Medicine

## 2016-09-16 ENCOUNTER — Encounter: Payer: Self-pay | Admitting: Sports Medicine

## 2016-09-16 DIAGNOSIS — R030 Elevated blood-pressure reading, without diagnosis of hypertension: Secondary | ICD-10-CM

## 2016-09-16 DIAGNOSIS — M5441 Lumbago with sciatica, right side: Secondary | ICD-10-CM | POA: Diagnosis not present

## 2016-09-16 DIAGNOSIS — M5442 Lumbago with sciatica, left side: Secondary | ICD-10-CM

## 2016-09-16 NOTE — Progress Notes (Signed)
OFFICE VISIT NOTE Brandon Foster. Brandon Foster [facility] [employer] at [facility] [phone]  Brandon Foster - 61 y.o. male MRN [id number]  Date of birth: Jun 04, 1955  Visit Date: 09/16/2016  PCP: Patient, No Pcp Per   Referred by: No ref. provider found  Brandon Foster, CMA acting as scribe for Dr. Berline Chough.  SUBJECTIVE:   Chief Complaint  Patient presents with  . Follow-up    bilateral low back pain with bilateral sciatica   HPI: As below and per problem based documentation when appropriate.  Pt presents today in follow-up of bilateral low back pain with bilateral sciatica. Pain seems to be worse with increased stress or increased physical activity. He has tried taking Flexeril and Robaxin in the past for the pain which has helped. He did note previously that he got more relief with the Flexeril but it affected his productivity at work. He has seen Dr. Alvester Morin and had MRI done 08/07/16 and epidural injection 08/26/16. He was seen last seen in the office 09/02/16 and had OMT. He was prescribed Gabapentin 400 mg to take 1 tablet every morning x 1 week, then increase to twice daily after 2 weeks, then increase to 3 times daily.  Pt has started Gabapentin but it was causing irritability and forgetfulness. He took if for 2 days and then stopped d/t side effects. He has noticed some improvement in low back pain after watching the [employer] exercise video. He doesn't think that he got much relief from the OMT. Overall he reports that he is feeling about the same but slightly better when he does the stretches. No change in the location of the pain. He was taking Aleve but stopped taking that last week. He missed a dose and noticed there wasn't much of a difference in his pain level when he missed it so he decided to stop taking it. He is no longer taking the Flexeril d/t lethargy. He is no longer taking Robaxin because he didn't get much relief and it "shifted" his mind. He has been  icing his back and getting some relief.   Pt denies fever, chills, night sweats.     Review of Systems  Constitutional: Negative for chills and fever.  Respiratory: Negative for shortness of breath and wheezing.   Cardiovascular: Negative for chest pain and palpitations.  Musculoskeletal: Positive for back pain. Negative for falls.  Neurological: Positive for tingling (toes/feet). Negative for dizziness and headaches.  Endo/Heme/Allergies: Does not bruise/bleed easily.    Otherwise per HPI.  HISTORY & PERTINENT PRIOR DATA:  No specialty comments available. He reports that he has quit smoking. He has never used smokeless tobacco. No results for input(s): HGBA1C, LABURIC in the last 8760 hours. Medications & Allergies reviewed per EMR Patient Active Problem List   Diagnosis Date Noted  . Elevated blood pressure reading without diagnosis of hypertension 09/15/2016  . Acute bilateral low back pain with bilateral sciatica 09/02/2016  . Adjustment disorder with depressed mood 09/03/2012  . Elevated PSA 12/31/2011  . Erectile dysfunction 12/16/2011  . Libido, decreased 12/16/2011  . Preventative health care 12/16/2011   Past Medical History:  Diagnosis Date  . History of chickenpox    Family History  Problem Relation Age of Onset  . Hypertension Father   . Diabetes Neg Hx    No past surgical history on file. Social History   Occupational History  . Not on file.   Social History Main Topics  . Smoking status: Former  Smoker  . Smokeless tobacco: Never Used  . Alcohol use No  . Drug use: No  . Sexual activity: Not on file    OBJECTIVE:  VS:  HT:6\' 1"  (185.4 cm)   WT:187 lb 6.4 oz (85 kg)  BMI:24.8    BP:(!) 142/86  HR:(!) 109bpm  TEMP: ( )  RESP:97 % EXAM: Findings:  Adult male.  No acute distress.  Alert and appropriate.  Overall good able go from sit to stand without significant symptoms.  Minimal pain with straight leg raise.       Xr C-arm No Report  Result  Date: 08/26/2016 Please see Notes or Procedures tab for imaging impression.  ASSESSMENT & PLAN:   Problem List Items Addressed This Visit    Acute bilateral low back pain with bilateral sciatica    He had only minimal improvement following osteopathic manipulation last visit this will be deferred today.  He does report good improvement with the foundations exercises however given this is only been 2 weeks he is not having lasting relief at this time.  He has not worked his way up to be able to perform the exercises all the way through the 12 minute video.  Emphasized the importance of trying to perform his exercises intermittently throughout the day as this will help with the neuromuscular dysfunction that does seem to be the underlying issue for him.  Extensive time spent discussing the pathophysiology of his underlying back pain as well as the path of physiology that is resulted in this dysfunction which is likely directly related to his sitting for work.  Patient's greatly appreciative of counseling and greater than 50% of this 30 minute visit was spent in doing so.  Gabapentin was previously not tolerated and he does not wish to try any TCA at this time but can consider adding this for neuromuscular modulation.      Elevated blood pressure reading without diagnosis of hypertension    Mildly elevated today.  He has been diligent about checking his blood pressures at home and reports consistent numbers within the low to mid 130s over the high 80s.  With normal heart rates.  Given the tachycardia that he continues to have a do think white coat hypertension is his main issue especially given normal values.  Initiation of medications is not indicated at this time but should continue with therapeutic lifestyle changes         Follow-up: Return in about 6 weeks (around 10/28/2016).   CMA/ATC served as Neurosurgeonscribe during this visit. History, Physical, and Plan performed by medical provider. Documentation  and orders reviewed and attested to.      Gaspar BiddingMichael Anarie Kalish, DO    [facility]

## 2016-09-17 NOTE — Assessment & Plan Note (Addendum)
He had only minimal improvement following osteopathic manipulation last visit this will be deferred today.  He does report good improvement with the foundations exercises however given this is only been 2 weeks he is not having lasting relief at this time.  He has not worked his way up to be able to perform the exercises all the way through the 12 minute video.  Emphasized the importance of trying to perform his exercises intermittently throughout the day as this will help with the neuromuscular dysfunction that does seem to be the underlying issue for him.  Extensive time spent discussing the pathophysiology of his underlying back pain as well as the path of physiology that is resulted in this dysfunction which is likely directly related to his sitting for work.  Patient's greatly appreciative of counseling and greater than 50% of this 30 minute visit was spent in doing so.  Gabapentin was previously not tolerated and he does not wish to try any TCA at this time but can consider adding this for neuromuscular modulation.

## 2016-09-17 NOTE — Assessment & Plan Note (Signed)
Mildly elevated today.  He has been diligent about checking his blood pressures at home and reports consistent numbers within the low to mid 130s over the high 80s.  With normal heart rates.  Given the tachycardia that he continues to have a do think white coat hypertension is his main issue especially given normal values.  Initiation of medications is not indicated at this time but should continue with therapeutic lifestyle changes

## 2016-10-28 ENCOUNTER — Ambulatory Visit: Payer: Managed Care, Other (non HMO) | Admitting: Sports Medicine

## 2017-08-14 ENCOUNTER — Encounter (HOSPITAL_COMMUNITY): Payer: Self-pay

## 2017-08-14 ENCOUNTER — Other Ambulatory Visit: Payer: Self-pay

## 2017-08-14 ENCOUNTER — Inpatient Hospital Stay (HOSPITAL_COMMUNITY)
Admission: EM | Admit: 2017-08-14 | Discharge: 2017-08-18 | DRG: 881 | Disposition: A | Discharge status: Home / Self Care | Payer: Managed Care, Other (non HMO) | Attending: Internal Medicine | Admitting: Internal Medicine

## 2017-08-14 DIAGNOSIS — F329 Major depressive disorder, single episode, unspecified: Secondary | ICD-10-CM | POA: Diagnosis not present

## 2017-08-14 DIAGNOSIS — Z87891 Personal history of nicotine dependence: Secondary | ICD-10-CM | POA: Diagnosis not present

## 2017-08-14 DIAGNOSIS — Z62810 Personal history of physical and sexual abuse in childhood: Secondary | ICD-10-CM | POA: Diagnosis present

## 2017-08-14 DIAGNOSIS — F1020 Alcohol dependence, uncomplicated: Secondary | ICD-10-CM | POA: Diagnosis not present

## 2017-08-14 DIAGNOSIS — Z7982 Long term (current) use of aspirin: Secondary | ICD-10-CM

## 2017-08-14 DIAGNOSIS — E860 Dehydration: Secondary | ICD-10-CM | POA: Diagnosis present

## 2017-08-14 DIAGNOSIS — F10239 Alcohol dependence with withdrawal, unspecified: Secondary | ICD-10-CM | POA: Diagnosis present

## 2017-08-14 DIAGNOSIS — D751 Secondary polycythemia: Secondary | ICD-10-CM | POA: Diagnosis present

## 2017-08-14 DIAGNOSIS — R45851 Suicidal ideations: Secondary | ICD-10-CM

## 2017-08-14 DIAGNOSIS — F1010 Alcohol abuse, uncomplicated: Secondary | ICD-10-CM | POA: Diagnosis not present

## 2017-08-14 DIAGNOSIS — Z79899 Other long term (current) drug therapy: Secondary | ICD-10-CM | POA: Diagnosis not present

## 2017-08-14 DIAGNOSIS — Z818 Family history of other mental and behavioral disorders: Secondary | ICD-10-CM | POA: Diagnosis not present

## 2017-08-14 DIAGNOSIS — F10232 Alcohol dependence with withdrawal with perceptual disturbance: Secondary | ICD-10-CM | POA: Diagnosis not present

## 2017-08-14 DIAGNOSIS — F10939 Alcohol use, unspecified with withdrawal, unspecified: Secondary | ICD-10-CM | POA: Diagnosis present

## 2017-08-14 DIAGNOSIS — Z811 Family history of alcohol abuse and dependence: Secondary | ICD-10-CM | POA: Diagnosis not present

## 2017-08-14 DIAGNOSIS — F32A Depression, unspecified: Secondary | ICD-10-CM

## 2017-08-14 HISTORY — DX: Depression, unspecified: F32.A

## 2017-08-14 HISTORY — DX: Major depressive disorder, single episode, unspecified: F32.9

## 2017-08-14 HISTORY — DX: Alcohol abuse, uncomplicated: F10.10

## 2017-08-14 LAB — CBC
HCT: 55.5 % — ABNORMAL HIGH (ref 39.0–52.0)
Hemoglobin: 19.8 g/dL — ABNORMAL HIGH (ref 13.0–17.0)
MCH: 30.4 pg (ref 26.0–34.0)
MCHC: 35.7 g/dL (ref 30.0–36.0)
MCV: 85.1 fL (ref 78.0–100.0)
PLATELETS: 245 10*3/uL (ref 150–400)
RBC: 6.52 MIL/uL — ABNORMAL HIGH (ref 4.22–5.81)
RDW: 14.4 % (ref 11.5–15.5)
WBC: 10.3 10*3/uL (ref 4.0–10.5)

## 2017-08-14 LAB — COMPREHENSIVE METABOLIC PANEL
ALT: 26 U/L (ref 17–63)
ANION GAP: 13 (ref 5–15)
AST: 31 U/L (ref 15–41)
Albumin: 4.1 g/dL (ref 3.5–5.0)
Alkaline Phosphatase: 49 U/L (ref 38–126)
BUN: 7 mg/dL (ref 6–20)
CALCIUM: 8.9 mg/dL (ref 8.9–10.3)
CHLORIDE: 102 mmol/L (ref 101–111)
CO2: 23 mmol/L (ref 22–32)
Creatinine, Ser: 0.98 mg/dL (ref 0.61–1.24)
GFR calc non Af Amer: >60 mL/min (ref >60)
Glucose, Bld: 100 mg/dL — ABNORMAL HIGH (ref 65–99)
Potassium: 4.2 mmol/L (ref 3.5–5.1)
SODIUM: 138 mmol/L (ref 135–145)
Total Bilirubin: 0.8 mg/dL (ref 0.3–1.2)
Total Protein: 7.1 g/dL (ref 6.5–8.1)

## 2017-08-14 LAB — ETHANOL: Alcohol, Ethyl (B): 191 mg/dL — ABNORMAL HIGH (ref <10)

## 2017-08-14 LAB — RAPID URINE DRUG SCREEN, HOSP PERFORMED
AMPHETAMINES: NOT DETECTED
Barbiturates: NOT DETECTED
Benzodiazepines: NOT DETECTED
Cocaine: NOT DETECTED
OPIATES: NOT DETECTED
Tetrahydrocannabinol: POSITIVE — AB

## 2017-08-14 LAB — PROTIME-INR
INR: 0.97
PROTHROMBIN TIME: 12.8 s (ref 11.4–15.2)

## 2017-08-14 MED ORDER — FAMOTIDINE 20 MG PO TABS
20.0000 mg | ORAL_TABLET | Freq: Every day | ORAL | Status: DC
  Administered 2017-08-15 – 2017-08-17 (×4): 20 mg via ORAL
  Filled 2017-08-14 (×5): qty 1

## 2017-08-14 MED ORDER — LORAZEPAM 2 MG/ML IJ SOLN
0.0000 mg | Freq: Two times a day (BID) | INTRAMUSCULAR | Status: DC
  Administered 2017-08-16: 2 mg via INTRAVENOUS
  Filled 2017-08-14: qty 1
  Filled 2017-08-14: qty 2

## 2017-08-14 MED ORDER — FOLIC ACID 1 MG PO TABS
1.0000 mg | ORAL_TABLET | Freq: Every day | ORAL | Status: DC
  Administered 2017-08-15 – 2017-08-18 (×4): 1 mg via ORAL
  Filled 2017-08-14 (×4): qty 1

## 2017-08-14 MED ORDER — ENOXAPARIN SODIUM 40 MG/0.4ML ~~LOC~~ SOLN
40.0000 mg | SUBCUTANEOUS | Status: DC
  Administered 2017-08-14 – 2017-08-17 (×4): 40 mg via SUBCUTANEOUS
  Filled 2017-08-14 (×4): qty 0.4

## 2017-08-14 MED ORDER — ADULT MULTIVITAMIN W/MINERALS CH
1.0000 | ORAL_TABLET | Freq: Every day | ORAL | Status: DC
  Administered 2017-08-15 – 2017-08-18 (×4): 1 via ORAL
  Filled 2017-08-14 (×4): qty 1

## 2017-08-14 MED ORDER — IBUPROFEN 200 MG PO TABS: 400.0000 mg | ORAL_TABLET | Freq: Four times a day (QID) | ORAL | Status: DC | PRN

## 2017-08-14 MED ORDER — LORAZEPAM 2 MG/ML IJ SOLN
0.0000 mg | Freq: Four times a day (QID) | INTRAMUSCULAR | Status: DC
  Administered 2017-08-14: 2 mg via INTRAVENOUS
  Filled 2017-08-14 (×2): qty 1

## 2017-08-14 MED ORDER — VITAMIN B-1 100 MG PO TABS
100.0000 mg | ORAL_TABLET | Freq: Every day | ORAL | Status: DC
  Administered 2017-08-15 – 2017-08-18 (×4): 100 mg via ORAL
  Filled 2017-08-14 (×4): qty 1

## 2017-08-14 MED ORDER — LORAZEPAM 2 MG/ML IJ SOLN: 0.0000 mg | Freq: Two times a day (BID) | INTRAMUSCULAR | Status: DC

## 2017-08-14 MED ORDER — SODIUM CHLORIDE 0.9 % IV BOLUS
1000.0000 mL | Freq: Once | INTRAVENOUS | Status: AC
  Administered 2017-08-14: 1000 mL via INTRAVENOUS

## 2017-08-14 MED ORDER — THIAMINE HCL 100 MG/ML IJ SOLN
100.0000 mg | Freq: Every day | INTRAMUSCULAR | Status: DC
  Administered 2017-08-14: 100 mg via INTRAVENOUS
  Filled 2017-08-14: qty 2

## 2017-08-14 MED ORDER — LORAZEPAM 1 MG PO TABS
1.0000 mg | ORAL_TABLET | Freq: Four times a day (QID) | ORAL | Status: AC | PRN
  Administered 2017-08-14 – 2017-08-17 (×6): 1 mg via ORAL
  Filled 2017-08-14 (×7): qty 1

## 2017-08-14 MED ORDER — THIAMINE HCL 100 MG/ML IJ SOLN: 100.0000 mg | Freq: Every day | INTRAMUSCULAR | Status: DC

## 2017-08-14 MED ORDER — LORAZEPAM 1 MG PO TABS: 0.0000 mg | ORAL_TABLET | Freq: Four times a day (QID) | ORAL | Status: DC

## 2017-08-14 MED ORDER — LORAZEPAM 2 MG/ML IJ SOLN: 1.0000 mg | Freq: Four times a day (QID) | INTRAMUSCULAR | Status: AC | PRN

## 2017-08-14 MED ORDER — LORAZEPAM 2 MG/ML IJ SOLN
0.0000 mg | Freq: Four times a day (QID) | INTRAMUSCULAR | Status: DC
  Administered 2017-08-14 – 2017-08-15 (×4): 2 mg via INTRAVENOUS
  Administered 2017-08-16: 1 mg via INTRAVENOUS
  Filled 2017-08-14 (×5): qty 1

## 2017-08-14 MED ORDER — LORAZEPAM 1 MG PO TABS
2.0000 mg | ORAL_TABLET | Freq: Once | ORAL | Status: AC
  Administered 2017-08-14: 2 mg via ORAL
  Filled 2017-08-14: qty 2

## 2017-08-14 MED ORDER — LORAZEPAM 2 MG/ML IJ SOLN
2.0000 mg | Freq: Once | INTRAMUSCULAR | Status: AC
  Administered 2017-08-14: 2 mg via INTRAVENOUS

## 2017-08-14 MED ORDER — SODIUM CHLORIDE 0.9 % IV SOLN
INTRAVENOUS | Status: DC
  Administered 2017-08-14: 16:00:00 via INTRAVENOUS

## 2017-08-14 MED ORDER — LORAZEPAM 1 MG PO TABS: 0.0000 mg | ORAL_TABLET | Freq: Two times a day (BID) | ORAL | Status: DC

## 2017-08-14 MED ORDER — VITAMIN B-1 100 MG PO TABS: 100.0000 mg | ORAL_TABLET | Freq: Every day | ORAL | Status: DC

## 2017-08-14 NOTE — ED Notes (Signed)
Bed: WTR6 Expected date:  Expected time:  Means of arrival:  Comments: 

## 2017-08-14 NOTE — ED Notes (Signed)
EKG given to EDP,Pollina,MD., for review. 

## 2017-08-14 NOTE — ED Notes (Signed)
ADMITTING Provider at bedside. 

## 2017-08-14 NOTE — ED Notes (Signed)
CALVIN RN 5 EAST AWARE ED HAS NO SITTER. PT IS SI. PT IS TRANSFERRING NOW. RACHAL C NT TRANSFERS PT TO 5 E[facility] WITHOUT EVENT

## 2017-08-14 NOTE — ED Notes (Signed)
ED TO INPATIENT HANDOFF REPORT  Name/Age/Gender Brandon Foster 62 y.o. male  Code Status    Code Status Orders  (From admission, onward)        Start     Ordered   08/14/17 1546  Full code  Continuous     08/14/17 1545    Code Status History    Date Active Date Inactive Code Status Order ID Comments User Context   08/14/2017 0614 08/14/2017 1545 Full Code [id number]  Orpah Greek, MD ED      Home/SNF/Other Home  Chief Complaint Detox  Level of Care/Admitting Diagnosis ED Disposition    ED Disposition Condition Foley: [facility] [100102]  Level of Care: Telemetry [5]  Admit to tele based on following criteria: Other see comments  Comments: Alcohol withdrawal  Diagnosis: Alcohol withdrawal ([place]) [291.81.ICD-9-CM]  Admitting Physician: Mariel Aloe [[id number]]  Attending Physician: Mariel Aloe [phone]  Estimated length of stay: past midnight tomorrow  Certification:: I certify this patient will need inpatient services for at least 2 midnights  PT Class (Do Not Modify): Inpatient [101]  PT Acc Code (Do Not Modify): Private [1]       Medical History Past Medical History:  Diagnosis Date  . Alcohol abuse   . Depression   . History of chickenpox     Allergies No Known Allergies  IV Location/Drains/Wounds Patient Lines/Drains/Airways Status   Active Line/Drains/Airways    Name:   Placement date:   Placement time:   Site:   Days:   Peripheral IV 08/14/17 Left Forearm   08/14/17    1029    Forearm   less than 1          Labs/Imaging Results for orders placed or performed during the hospital encounter of 08/14/17 (from the past 48 hour(s))  Comprehensive metabolic panel     Status: Abnormal   Collection Time: 08/14/17  6:33 AM  Result Value Ref Range   Sodium 138 135 - 145 mmol/L   Potassium 4.2 3.5 - 5.1 mmol/L   Chloride 102 101 - 111 mmol/L   CO2 23 22 - 32 mmol/L   Glucose, Bld 100 (H) 65 - 99  mg/dL   BUN 7 6 - 20 mg/dL   Creatinine, Ser 0.98 0.61 - 1.24 mg/dL   Calcium 8.9 8.9 - 10.3 mg/dL   Total Protein 7.1 6.5 - 8.1 g/dL   Albumin 4.1 3.5 - 5.0 g/dL   AST 31 15 - 41 U/L   ALT 26 17 - 63 U/L   Alkaline Phosphatase 49 38 - 126 U/L   Total Bilirubin 0.8 0.3 - 1.2 mg/dL   GFR calc non Af Amer >60 >60 mL/min   GFR calc Af Amer >60 >60 mL/min    Comment: (NOTE) The eGFR has been calculated using the CKD EPI equation. This calculation has not been validated in all clinical situations. eGFR's persistently <60 mL/min signify possible Chronic Kidney Disease.    Anion gap 13 5 - 15    Comment: Performed at [facility], [place] [address], [place], [place] [address]  Ethanol     Status: Abnormal   Collection Time: 08/14/17  6:33 AM  Result Value Ref Range   Alcohol, Ethyl (B) 191 (H) <10 mg/dL    Comment: (NOTE) Lowest detectable limit for serum alcohol is 10 mg/dL. For medical purposes only. Performed at [facility], [address] [place], [place]  [address]   cbc     Status: Abnormal   Collection Time: 08/14/17  6:33 AM  Result Value Ref Range   WBC 10.3 4.0 - 10.5 K/uL   RBC 6.52 (H) 4.22 - 5.81 MIL/uL   Hemoglobin 19.8 (H) 13.0 - 17.0 g/dL   HCT 55.5 (H) 39.0 - 52.0 %   MCV 85.1 78.0 - 100.0 fL   MCH 30.4 26.0 - 34.0 pg   MCHC 35.7 30.0 - 36.0 g/dL   RDW 14.4 11.5 - 15.5 %   Platelets 245 150 - 400 K/uL    Comment: Performed at [facility], [place] [address], [place], [place] [address]  Rapid urine drug screen (hospital performed)     Status: Abnormal   Collection Time: 08/14/17  6:43 AM  Result Value Ref Range   Opiates NONE DETECTED NONE DETECTED   Cocaine NONE DETECTED NONE DETECTED   Benzodiazepines NONE DETECTED NONE DETECTED   Amphetamines NONE DETECTED NONE DETECTED   Tetrahydrocannabinol POSITIVE (A) NONE DETECTED   Barbiturates NONE DETECTED NONE DETECTED    Comment: (NOTE) DRUG SCREEN FOR  MEDICAL PURPOSES ONLY.  IF CONFIRMATION IS NEEDED FOR ANY PURPOSE, NOTIFY LAB WITHIN 5 DAYS. LOWEST DETECTABLE LIMITS FOR URINE DRUG SCREEN Drug Class                     Cutoff (ng/mL) Amphetamine and metabolites    1000 Barbiturate and metabolites    200 Benzodiazepine                 732 Tricyclics and metabolites     300 Opiates and metabolites        300 Cocaine and metabolites        300 THC                            50 Performed at [facility], [place] [address] [place], [place] [address]   Protime-INR     Status: None   Collection Time: 08/14/17  7:10 AM  Result Value Ref Range   Prothrombin Time 12.8 11.4 - 15.2 seconds   INR 0.97     Comment: Performed at [facility], [place] [address], [place],  [address]   No results found.  Pending Labs Unresulted Labs (From admission, onward)   Start     Ordered   08/21/17 0500  Creatinine, serum  (enoxaparin (LOVENOX)    CrCl >/= 30 ml/min)  Weekly,   R    Comments:  while on enoxaparin therapy    08/14/17 1545   08/15/17 3762  Basic metabolic panel  Tomorrow morning,   R     08/14/17 1545   08/15/17 0500  CBC  Tomorrow morning,   R     08/14/17 1545   08/14/17 1546  HIV antibody (Routine Testing)  Once,   R     08/14/17 1545      Vitals/Pain Today's Vitals   08/14/17 1222 08/14/17 1255 08/14/17 1300 08/14/17 1330  BP: (!) 125/95  129/84 136/90  Pulse: (!) 117  (!) 106 (!) 113  Resp: 18  14 (!) 21  Temp:      TempSrc:      SpO2: 95%  96% 95%  Weight:      Height:      PainSc:  Asleep      Isolation Precautions No active isolations  Medications Medications  LORazepam (ATIVAN) tablet 1 mg (has no administration in time range)    Or  LORazepam (ATIVAN) injection 1 mg (has no administration in time range)  thiamine (VITAMIN B-1) tablet 100 mg (has no administration in time range)    Or  thiamine (B-1) injection 100 mg (has no administration in time range)  folic  acid (FOLVITE) tablet 1 mg (has no administration in time range)  multivitamin with minerals tablet 1 tablet (has no administration in time range)  LORazepam (ATIVAN) injection 0-4 mg (2 mg Intravenous Given 08/14/17 1600)    Followed by  LORazepam (ATIVAN) injection 0-4 mg (has no administration in time range)  enoxaparin (LOVENOX) injection 40 mg (has no administration in time range)  ibuprofen (ADVIL,MOTRIN) tablet 400 mg (has no administration in time range)  sodium chloride 0.9 % bolus 1,000 mL (1,000 mLs Intravenous New Bag/Given 08/14/17 1525)    Followed by  0.9 %  sodium chloride infusion ( Intravenous New Bag/Given 08/14/17 1558)  LORazepam (ATIVAN) tablet 2 mg (2 mg Oral Given 08/14/17 0654)  sodium chloride 0.9 % bolus 1,000 mL (0 mLs Intravenous Stopped 08/14/17 1134)  LORazepam (ATIVAN) injection 2 mg (2 mg Intravenous Given 08/14/17 1103)    Mobility walks

## 2017-08-14 NOTE — ED Provider Notes (Signed)
Patient has been monitored here in the ED awaiting placement for his suicidal ideations.  He is also been treated for alcohol detox.  Despite receiving doses of IV Ativan his alcohol withdrawal scale continues to increase.  He remains tachycardic and tremulous.  Nurse reports that patient has had periods of confusion.  Suspect patient is going into DTs and will admit to the hospitalist service   Lorre Nick, MD 08/14/17 1308

## 2017-08-14 NOTE — ED Notes (Addendum)
REPORT GIVEN BY [facility] NURSE THAT ADMITTING MD RECORDS -PT ADMITS TO SUICIDAL IDEATIONS AND NEEDS SITTER. [facility] NURSE IS PAGING ADMITTING MD AT PRESENT. THIS RN INFORMED RECEIVING RN/FLOOR ABOUT CURRENT PT'S STATUS. RETURNED CALL FROM ERICA RN/ [facility]. PT HAS SITTER ORDER FOR SI. BE ADVISED MAY CHANGE ROOM ASSIGNMENT. CHARGE MORGAN RN AWARE OF PT CURRENT STATUS AND BED STATUS

## 2017-08-14 NOTE — ED Notes (Signed)
Lab called. Pt's hematocrit was elevated. Therefore, new coagulation tests need to be drawn the adjusted light blue tubes. Lab will send over the new tubes to recollect the labs.

## 2017-08-14 NOTE — ED Notes (Signed)
CURTAIN CLOSED IN PT'S ROOM. URINE ON FLOOR. PT BACK IN BED. INQUIRED WITH PT ABOUT THESE FINDINGS. PT STATES HE NEEDED TO USE THE BATHROOM. PT REMINDED TO REQUEST FOR ASSISTANCE AND CURTAIN MUST REMAIN OPEN FOR PT'S SAFETY.

## 2017-08-14 NOTE — ED Notes (Signed)
PT CONTINUES TO PULL AT LINES( REMOVING HIMSELF FROM CARDIAC MONITOR, BP, PULSE OX)  URINE ON FLOOR. PT REPORTS HE WENT TO BATHROOM. PT WAS REMINDED HIS CALL BELL WAS IN THE BED AND HE HAD BEEN ENCOURAGED TO CALL FOR ASSISTANCE. PT HAD REMEMBERED CONVERSATION HOWEVER WAS NOT FOLLOWING THE DIRECTIONS GIVEN. THIS WAS OUR THIRD CONVERSATION. PT RESTLESS. PT MOVED TO ROOM FOR BETTER VISUAL.

## 2017-08-14 NOTE — ED Notes (Signed)
ED Provider at bedside. ALLEN UPDATED ON PT CURRENT STATUS
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
## 2017-08-14 NOTE — ED Notes (Signed)
TTS AT BEDSIDE 

## 2017-08-14 NOTE — H&P (Signed)
History and Physical    Brandon Foster WUJ:[id number] DOB: May 06, 1955 DOA: 08/14/2017  PCP: Patient, No Pcp Per  Patient coming from: Home  Chief Complaint: Alcohol detox  HPI: Brandon Foster is a 62 y.o. male with medical history significant of alcohol abuse, depression.  Patient reports that he has been drinking a 12 pack case every day for the last 3 to 4 days.  He reports being significantly depressed.  He states that he had thoughts of suicide but no plan for suicide.  He then decided that he wanted to have detox secondary to symptoms of shakiness.  He did not take anything for his withdrawal symptoms.  Symptoms have improved with Ativan in the emergency department.  ED Course: Vitals: Afebrile, tachycardic, normal respirations, normotensive, on room air Labs: Hemoglobin of 19.8, positive for THC. Imaging: None available Medications/Course: Ativan and normal saline  Review of Systems: Review of Systems  Constitutional: Negative for chills and fever.  Cardiovascular: Negative for chest pain.  Neurological: Positive for tremors.  Psychiatric/Behavioral: Positive for depression, hallucinations and suicidal ideas (None currently. no plan.).  All other systems reviewed and are negative.   Past Medical History:  Diagnosis Date  . Alcohol abuse   . Depression   . History of chickenpox     History reviewed. No pertinent surgical history.   reports that he has quit smoking. He has never used smokeless tobacco. He reports that he drinks about 2.4 oz of alcohol per week. He reports that he does not use drugs.  No Known Allergies  Family History  Problem Relation Age of Onset  . Hypertension Father   . Alcohol abuse Father   . Alcohol abuse Sister   . Alcohol abuse Paternal Grandmother   . Diabetes Neg Hx     Prior to Admission medications   Medication Sig Start Date End Date Taking? Authorizing Provider  aspirin 325 MG EC tablet Take 325 mg by mouth daily.   Yes [provider]  Multiple Vitamin (MULTIVITAMIN) tablet Take 1 tablet by mouth daily.   Yes [provider]  tadalafil (CIALIS) 10 MG tablet Use as directed Patient taking differently: Take 5 mg by mouth daily as needed for erectile dysfunction.  11/16/14  Yes Nafziger, Kandee Keen, NP    Physical Exam: Vitals:   08/14/17 1200 08/14/17 1222 08/14/17 1300 08/14/17 1330  BP: 140/86 (!) 125/95 129/84 136/90  Pulse: (!) 114 (!) 117 (!) 106 (!) 113  Resp: (!) 21  Temp:      TempSrc:      SpO2: 94% 95% 96% 95%  Weight:      Height:         Constitutional: NAD, calm, comfortable Eyes: PERRL, lids and conjunctivae normal ENMT: Mucous membranes are dry. Posterior pharynx clear of any exudate or lesions. Neck: normal, supple, no masses, no thyromegaly Respiratory: clear to auscultation bilaterally, no wheezing, no crackles. Normal respiratory effort. No accessory muscle use.  Cardiovascular: Increased rate and regular rhythm, no murmurs / rubs / gallops. No extremity edema. 2+ pedal pulses. Abdomen: no tenderness, no masses palpated. No hepatosplenomegaly. Bowel sounds positive.  Musculoskeletal: no clubbing / cyanosis. No joint deformity upper and lower extremities. Good ROM, no contractures. Normal muscle tone.  Skin: no rashes, lesions, ulcers. No induration Neurologic: CN 2-12 grossly intact. Sensation intact, DTR normal. Strength 5/5 in all 4.  Psychiatric: Normal judgment and insight. Alert and oriented x 3. Depressed mood and flat affect. Denies suicidal ideation.  Labs on Admission: I have personally reviewed following labs and imaging studies  CBC: Recent Labs  Lab 08/14/17 0633  WBC 10.3  HGB 19.8*  HCT 55.5*  MCV 85.1  PLT 245   Basic Metabolic Panel: Recent Labs  Lab 08/14/17 0633  NA 138  K 4.2  CL 102  CO2 23  GLUCOSE 100*  BUN 7  CREATININE 0.98  CALCIUM 8.9   GFR: Estimated Creatinine Clearance: 89.5 mL/min (by C-G formula based on SCr of  0.98 mg/dL). Liver Function Tests: Recent Labs  Lab 08/14/17 0633  AST 31  ALT 26  ALKPHOS 49  BILITOT 0.8  PROT 7.1  ALBUMIN 4.1   No results for input(s): LIPASE, AMYLASE in the last 168 hours. No results for input(s): AMMONIA in the last 168 hours. Coagulation Profile: Recent Labs  Lab 08/14/17 0710  INR 0.97    EKG: Independently reviewed. Sinus tachycardia  Assessment/Plan Principal Problem:   Alcohol withdrawal (HCC) Active Problems:   Depression   Alcohol abuse    Alcohol withdrawal Patient significantly high CIWA scores requiring frequent Ativan dosing this morning. Last drink many days ago. Hallucinations improved. -Continue CIWA -Continue MVI, thiamine, folate  Depression Passive suicidal ideation Patient evaluated by [facility] and recommended for inpatient admission. Patient is not under IVC.  Alcohol abuse Driver of mood issues. Has a history of alcohol abuse. AA in the past. -Social work consult  THC positive Seen on UDS  Polycythemia Normal platelets. Patient with a history of smoking but nothing recently. No recent hemoglobin. -repeat CBC in AM  Dehydration As evidenced by exam and tachycardia. -IV fluid bolus followed by maintenance fluid   DVT prophylaxis: Lovenox Code Status: Full code  Family Communication: None at bedside Disposition Plan: Discharge to behavioral health when withdrawal symptoms improved. Consults called: Psychiatry by EDP Admission status: Inpatient, telemetry   Jacquelin Hawking, MD [facility] Pager [phone]  If 7PM-7AM, please contact night-coverage [email or web address] Password [email or web address]  08/14/2017, 2:25 PM

## 2017-08-14 NOTE — ED Provider Notes (Signed)
[name] Provider Note   CSN: [id number] Arrival date & time: 08/14/17  0557     History   Chief Complaint Chief Complaint  Patient presents with  . alcohol detox    HPI Brandon Foster is a 62 y.o. male.  Patient presents to the ER for help with alcohol problem.  He reports that he has had a problem with alcoholism for many years.  He has been through programs at [facility] and has had sober times, but recently started drinking again.  He has been drinking continuously for the last 3 or 4 days.  Patient reports he has not had a drink for approximately 8 hours and is starting to feel shaky.  Reports a history of having severe tremors with withdrawal but never had a seizure.  Patient has been feeling depressed over his drinking.  He has had thoughts of suicide and states that he wants to "kill the pain".     Past Medical History:  Diagnosis Date  . History of chickenpox     Patient Active Problem List   Diagnosis Date Noted  . Elevated blood pressure reading without diagnosis of hypertension 09/15/2016  . Acute bilateral low back pain with bilateral sciatica 09/02/2016  . Adjustment disorder with depressed mood 09/03/2012  . Elevated PSA 12/31/2011  . Erectile dysfunction 12/16/2011  . Libido, decreased 12/16/2011  . Preventative health care 12/16/2011    History reviewed. No pertinent surgical history.      Home Medications    Prior to Admission medications   Medication Sig Start Date End Date Taking? Authorizing Provider  aspirin 325 MG EC tablet Take 325 mg by mouth daily.    [provider]  Multiple Vitamin (MULTIVITAMIN) tablet Take 1 tablet by mouth daily.    [provider]  tadalafil (CIALIS) 10 MG tablet Use as directed Patient taking differently: 5 mg. Use as directed 11/16/14   Shirline Frees, NP    Family History Family History  Problem Relation Age of Onset  . Hypertension Father   . Diabetes Neg Hx      Social History Social History   Tobacco Use  . Smoking status: Former Games developer  . Smokeless tobacco: Never Used  Substance Use Topics  . Alcohol use: No  . Drug use: No     Allergies   Patient has no known allergies.   Review of Systems Review of Systems  Psychiatric/Behavioral: Positive for dysphoric mood.  All other systems reviewed and are negative.    Physical Exam Updated Vital Signs BP (!) 136/101 (BP Location: Left Arm)   Pulse (!) 108   Temp 98.2 F (36.8 C) (Oral)   Resp 16   SpO2 96%   Physical Exam  Constitutional: He is oriented to person, place, and time. He appears well-developed and well-nourished. No distress.  HENT:  Head: Normocephalic and atraumatic.  Right Ear: Hearing normal.  Left Ear: Hearing normal.  Nose: Nose normal.  Mouth/Throat: Oropharynx is clear and moist and mucous membranes are normal.  Eyes: Pupils are equal, round, and reactive to light. Conjunctivae and EOM are normal.  Neck: Normal range of motion. Neck supple.  Cardiovascular: Regular rhythm, S1 normal and S2 normal. Tachycardia present. Exam reveals no gallop and no friction rub.  No murmur heard. Pulmonary/Chest: Effort normal and breath sounds normal. No respiratory distress. He exhibits no tenderness.  Abdominal: Soft. Normal appearance and bowel sounds are normal. There is no hepatosplenomegaly. There is no tenderness. There is  no rebound, no guarding, no tenderness at McBurney's point and negative Murphy's sign. No hernia.  Musculoskeletal: Normal range of motion.  Neurological: He is alert and oriented to person, place, and time. He has normal strength. No cranial nerve deficit or sensory deficit. Coordination normal. GCS eye subscore is 4. GCS verbal subscore is 5. GCS motor subscore is 6.  Skin: Skin is warm, dry and intact. No rash noted. No cyanosis.  Psychiatric: His speech is normal. He is withdrawn. He exhibits a depressed mood.  Nursing note and vitals  reviewed.    ED Treatments / Results  Labs (all labs ordered are listed, but only abnormal results are displayed) Labs Reviewed  COMPREHENSIVE METABOLIC PANEL  ETHANOL  CBC  RAPID URINE DRUG SCREEN, HOSP PERFORMED  PROTIME-INR    EKG None  Radiology No results found.  Procedures Procedures (including critical care time)  Medications Ordered in ED Medications  LORazepam (ATIVAN) tablet 2 mg (has no administration in time range)     Initial Impression / Assessment and Plan / ED Course  I have reviewed the triage vital signs and the nursing notes.  Pertinent labs & imaging results that were available during my care of the patient were reviewed by me and considered in my medical decision making (see chart for details).     Patient here predominantly because of alcoholism and requesting detox.  Patient also, however, admits to being depressed and has had thoughts of harming himself.  He does not have an active plan.  This seems to stem predominantly from his alcohol use.  Patient will be initiated on CIWA protocol will have psychiatric evaluation.  Final Clinical Impressions(s) / ED Diagnoses   Final diagnoses:  Depression, unspecified depression type  Alcoholism [facility])    ED Discharge Orders    None       [name], MD 08/14/17 [phone]

## 2017-08-14 NOTE — ED Triage Notes (Signed)
Pt requesting detox Pt's drank 36-48 beers in the last two days Pt denies SI but states he's drinking because he's depressed

## 2017-08-15 DIAGNOSIS — Z79899 Other long term (current) drug therapy: Secondary | ICD-10-CM

## 2017-08-15 DIAGNOSIS — R45851 Suicidal ideations: Secondary | ICD-10-CM

## 2017-08-15 DIAGNOSIS — F1010 Alcohol abuse, uncomplicated: Secondary | ICD-10-CM

## 2017-08-15 DIAGNOSIS — F10232 Alcohol dependence with withdrawal with perceptual disturbance: Secondary | ICD-10-CM

## 2017-08-15 DIAGNOSIS — F329 Major depressive disorder, single episode, unspecified: Principal | ICD-10-CM

## 2017-08-15 LAB — BASIC METABOLIC PANEL
Anion gap: 5 (ref 5–15)
BUN: 14 mg/dL (ref 6–20)
CALCIUM: 7.8 mg/dL — AB (ref 8.9–10.3)
CO2: 26 mmol/L (ref 22–32)
Chloride: 109 mmol/L (ref 101–111)
Creatinine, Ser: 1.01 mg/dL (ref 0.61–1.24)
GFR calc Af Amer: >60 mL/min (ref >60)
GLUCOSE: 87 mg/dL (ref 65–99)
Potassium: 4 mmol/L (ref 3.5–5.1)
Sodium: 140 mmol/L (ref 135–145)

## 2017-08-15 LAB — CBC
HCT: 47.7 % (ref 39.0–52.0)
Hemoglobin: 16.4 g/dL (ref 13.0–17.0)
MCH: 30.5 pg (ref 26.0–34.0)
MCHC: 34.4 g/dL (ref 30.0–36.0)
MCV: 88.8 fL (ref 78.0–100.0)
Platelets: 175 10*3/uL (ref 150–400)
RBC: 5.37 MIL/uL (ref 4.22–5.81)
RDW: 14.7 % (ref 11.5–15.5)
WBC: 8 10*3/uL (ref 4.0–10.5)

## 2017-08-15 LAB — MAGNESIUM: MAGNESIUM: 2 mg/dL (ref 1.7–2.4)

## 2017-08-15 LAB — HIV ANTIBODY (ROUTINE TESTING W REFLEX): HIV SCREEN 4TH GENERATION: NONREACTIVE

## 2017-08-15 MED ORDER — DIVALPROEX SODIUM ER 500 MG PO TB24
500.0000 mg | ORAL_TABLET | Freq: Every day | ORAL | Status: DC
  Administered 2017-08-15 – 2017-08-17 (×3): 500 mg via ORAL
  Filled 2017-08-15 (×3): qty 1

## 2017-08-15 MED ORDER — SODIUM CHLORIDE 0.9 % IV SOLN
INTRAVENOUS | Status: DC
  Administered 2017-08-15 – 2017-08-16 (×4): via INTRAVENOUS

## 2017-08-15 NOTE — Progress Notes (Signed)
Received orders from Dr. Sharma Covert (psychiatry) that patient does not need a sitter at this time. RN will continue to monitor patient.

## 2017-08-15 NOTE — Progress Notes (Signed)
PROGRESS NOTE    Brandon Foster  ZOX:[id number] DOB: 1955/04/11 DOA: 08/14/2017 PCP: Patient, No Pcp Per    Brief Narrative:  Brandon Foster is a 62 y.o. male with medical history significant of alcohol abuse, depression.  Patient reports that he has been drinking a 12 pack case every day for the last 3 to 4 days.  He reports being significantly depressed.  He states that he had thoughts of suicide but no plan for suicide.  He then decided that he wanted to have detox secondary to symptoms of shakiness.  He did not take anything for his withdrawal symptoms.  Symptoms have improved with Ativan in the emergency department.  ED Course: Vitals: Afebrile, tachycardic, normal respirations, normotensive, on room air Labs: Hemoglobin of 19.8, positive for THC. Imaging: None available Medications/Course: Ativan and normal saline      Assessment & Plan:   Principal Problem:   Depression Active Problems:   Alcohol withdrawal (HCC)   Alcohol abuse   Suicidal ideation  1 alcohol withdrawal Patient admitted placed on the Ativan withdrawal protocol.  Improving slowly.  Continue supportive care with IV fluids, thiamine, folic acid, multivitamin.  Continue Ativan withdrawal protocol.  Follow.  2.  Depression/passive suicidal ideation Patient initially seen by counselor in the ED who had recommended inpatient admission.  Patient denies any suicidal ideation at this time.  Psychiatry reconsulted patient seen in consultation by Dr. Sharma Covert who has recommended patient be started on Depakote 500 mg nightly for mood stabilization.  Psychiatry recommending social work provide patient with resources for substance abuse treatment and local psychiatrist.  It is felt that patient is cleared psychiatry and does not need inpatient psychiatric treatment once stable.  Outpatient follow-up with psychiatry.  3.  Dehydration IV fluids.  4.  Polycythemia Platelet count improved currently at 175.  Outpatient  follow-up.   DVT prophylaxis: Lovenox Code Status: Full Family Communication: Updated,  patient no family at bedside. Disposition Plan: To be determined   Consultants:   Psychiatry: Dr. Sharma Covert 08/15/2017  Procedures:   None  Antimicrobials:   None   Subjective: Sleeping.  Easily arousable.  Denies any chest pain no shortness of breath.  Patient denies any current suicidal ideation.  No homicidal ideation.  Sitter at bedside.  Patient asking whether he can shower.  Objective: Vitals:   08/14/17 2300 08/15/17 0515 08/15/17 1250 08/15/17 1557  BP: 115/73 125/74 135/79   Pulse: 80 80 88 70  Resp: Temp: 97.7 F (36.5 C) 97.9 F (36.6 C) 97.8 F (36.6 C)   TempSrc: Oral Oral Oral   SpO2: 94% 94% 93%   Weight:      Height:        Intake/Output Summary (Last 24 hours) at 08/15/2017 2001 Last data filed at 08/15/2017 1600 Gross per 24 hour  Intake 3110.42 ml  Output -  Net 3110.42 ml   Filed Weights   08/14/17 0646  Weight: 83.9 kg (185 lb)    Examination:  General exam: Appears calm and comfortable  Respiratory system: Clear to auscultation. Respiratory effort normal. Cardiovascular system: S1 & S2 heard, RRR. No JVD, murmurs, rubs, gallops or clicks. No pedal edema. Gastrointestinal system: Abdomen is nondistended, soft and nontender. No organomegaly or masses felt. Normal bowel sounds heard. Central nervous system: Alert and oriented. No focal neurological deficits. Extremities: Symmetric 5 x 5 power. Skin: No rashes, lesions or ulcers Psychiatry: Judgement and insight appear normal. Mood & affect appropriate.  Data Reviewed: I have personally reviewed following labs and imaging studies  CBC: Recent Labs  Lab 08/14/17 0633 08/15/17 0637  WBC 10.3 8.0  HGB 19.8* 16.4  HCT 55.5* 47.7  MCV 85.1 88.8  PLT 245 175   Basic Metabolic Panel: Recent Labs  Lab 08/14/17 0633 08/15/17 0637  NA 138 140  K 4.2 4.0  CL 102 109  CO2 23 26   GLUCOSE 100* 87  BUN 7 14  CREATININE 0.98 1.01  CALCIUM 8.9 7.8*  MG  --  2.0   GFR: Estimated Creatinine Clearance: 86.8 mL/min (by C-G formula based on SCr of 1.01 mg/dL). Liver Function Tests: Recent Labs  Lab 08/14/17 0633  AST 31  ALT 26  ALKPHOS 49  BILITOT 0.8  PROT 7.1  ALBUMIN 4.1   No results for input(s): LIPASE, AMYLASE in the last 168 hours. No results for input(s): AMMONIA in the last 168 hours. Coagulation Profile: Recent Labs  Lab 08/14/17 0710  INR 0.97   Cardiac Enzymes: No results for input(s): CKTOTAL, CKMB, CKMBINDEX, TROPONINI in the last 168 hours. BNP (last 3 results) No results for input(s): PROBNP in the last 8760 hours. HbA1C: No results for input(s): HGBA1C in the last 72 hours. CBG: No results for input(s): GLUCAP in the last 168 hours. Lipid Profile: No results for input(s): CHOL, HDL, LDLCALC, TRIG, CHOLHDL, LDLDIRECT in the last 72 hours. Thyroid Function Tests: No results for input(s): TSH, T4TOTAL, FREET4, T3FREE, THYROIDAB in the last 72 hours. Anemia Panel: No results for input(s): VITAMINB12, FOLATE, FERRITIN, TIBC, IRON, RETICCTPCT in the last 72 hours. Sepsis Labs: No results for input(s): PROCALCITON, LATICACIDVEN in the last 168 hours.  No results found for this or any previous visit (from the past 240 hour(s)).       Radiology Studies: No results found.      Scheduled Meds: . divalproex  500 mg Oral QHS  . enoxaparin (LOVENOX) injection  40 mg Subcutaneous Q24H  . famotidine  20 mg Oral QHS  . folic acid  1 mg Oral Daily  . LORazepam  0-4 mg Intravenous Q6H   Followed by  . [START ON 08/16/2017] LORazepam  0-4 mg Intravenous Q12H  . multivitamin with minerals  1 tablet Oral Daily  . thiamine  100 mg Oral Daily   Or  . thiamine  100 mg Intravenous Daily   Continuous Infusions: . sodium chloride 100 mL/hr at 08/14/17 1558  . sodium chloride 125 mL/hr at 08/15/17 1846     LOS: 1 day    Time spent: 40  minutes    Ramiro Harvest, MD [facility] Pager [phone]  If 7PM-7AM, please contact night-coverage [email or web address] Password [email or web address] 08/15/2017, 8:01 PM
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
## 2017-08-15 NOTE — Plan of Care (Signed)
  Problem: Pain Managment: Goal: General experience of comfort will improve Outcome: Progressing   Problem: Safety: Goal: Ability to remain free from injury will improve Outcome: Progressing   

## 2017-08-16 NOTE — Progress Notes (Signed)
PROGRESS NOTE    Arieh Bogue  ZOX:[id number] DOB: 26-Apr-1955 DOA: 08/14/2017 PCP: Patient, No Pcp Per    Brief Narrative:  Brandon Foster is a 62 y.o. male with medical history significant of alcohol abuse, depression.  Patient reports that he has been drinking a 12 pack case every day for the last 3 to 4 days.  He reports being significantly depressed.  He states that he had thoughts of suicide but no plan for suicide.  He then decided that he wanted to have detox secondary to symptoms of shakiness.  He did not take anything for his withdrawal symptoms.  Symptoms have improved with Ativan in the emergency department.  ED Course: Vitals: Afebrile, tachycardic, normal respirations, normotensive, on room air Labs: Hemoglobin of 19.8, positive for THC. Imaging: None available Medications/Course: Ativan and normal saline      Assessment & Plan:   Principal Problem:   Depression Active Problems:   Alcohol withdrawal (HCC)   Alcohol abuse   Suicidal ideation  1 alcohol withdrawal Patient admitted placed on the Ativan withdrawal protocol.  Improving clinically.  Continue Ativan withdrawal protocol, thiamine, folic acid, multivitamin.  Supportive care.    2.  Depression/passive suicidal ideation Patient initially seen by counselor in the ED who had recommended inpatient admission.  Patient denies any suicidal ideation at this time.  Psychiatry reconsulted patient seen in consultation by Dr. Sharma Covert who recommended patient be started on Depakote 500 mg nightly for mood stabilization.  Psychiatry recommending social work provide patient with resources for substance abuse treatment and local psychiatrist.  It is felt that patient is cleared psychiatry and does not need inpatient psychiatric treatment once stable.  Outpatient follow-up with psychiatry.  3.  Dehydration Improved with hydration.  Saline lock IV fluids.    4.  Polycythemia Platelet count improved. Outpatient follow-up.   DVT  prophylaxis: Lovenox Code Status: Full Family Communication: Updated,  patient no family at bedside. Disposition Plan: Home once Ativan withdrawal protocol has been completed hopefully in 48 hours.    Consultants:   Psychiatry: Dr. Sharma Covert 08/15/2017  Procedures:   None  Antimicrobials:   None   Subjective: Sleeping.  Arousable.  Denies any chest pain no shortness of breath.  Patient asking whether he could be placed on Lamictal instead of Depakote.    Objective: Vitals:   08/15/17 1557 08/15/17 2045 08/16/17 0521 08/16/17 1415  BP:  (!) 142/92 (!) 133/97 (!) 148/93  Pulse: 70 78 80 86  Resp:  17 19 18   Temp:  (!) 97.4 F (36.3 C) 98 F (36.7 C) 97.9 F (36.6 C)  TempSrc:  Oral Oral Oral  SpO2:  95% 95% 93%  Weight:      Height:        Intake/Output Summary (Last 24 hours) at 08/16/2017 1902 Last data filed at 08/16/2017 1412 Gross per 24 hour  Intake 1155 ml  Output 1000 ml  Net 155 ml   Filed Weights   08/14/17 0646  Weight: 83.9 kg (185 lb)    Examination:  General exam: NAD. Respiratory system: Lungs clear to auscultation bilaterally.  No wheezes, no crackles, no rhonchi.  Respiratory effort normal. Cardiovascular system: Regular rate rhythm no murmurs rubs or gallops.  No JVD.  No lower extremity edema.  Gastrointestinal system: Abdomen is nontender, nondistended, positive bowel sounds.  No rebound.  No guarding.   Central nervous system: Alert and oriented. No focal neurological deficits. Extremities: Symmetric 5 x 5 power. Skin: No rashes, lesions or  ulcers Psychiatry: Judgement and insight appear normal. Mood & affect appropriate.     Data Reviewed: I have personally reviewed following labs and imaging studies  CBC: Recent Labs  Lab 08/14/17 0633 08/15/17 0637  WBC 10.3 8.0  HGB 19.8* 16.4  HCT 55.5* 47.7  MCV 85.1 88.8  PLT 245 175   Basic Metabolic Panel: Recent Labs  Lab 08/14/17 0633 08/15/17 0637  NA 138 140  K 4.2 4.0  CL 102  109  CO2 23 26  GLUCOSE 100* 87  BUN 7 14  CREATININE 0.98 1.01  CALCIUM 8.9 7.8*  MG  --  2.0   GFR: Estimated Creatinine Clearance: 86.8 mL/min (by C-G formula based on SCr of 1.01 mg/dL). Liver Function Tests: Recent Labs  Lab 08/14/17 0633  AST 31  ALT 26  ALKPHOS 49  BILITOT 0.8  PROT 7.1  ALBUMIN 4.1   No results for input(s): LIPASE, AMYLASE in the last 168 hours. No results for input(s): AMMONIA in the last 168 hours. Coagulation Profile: Recent Labs  Lab 08/14/17 0710  INR 0.97   Cardiac Enzymes: No results for input(s): CKTOTAL, CKMB, CKMBINDEX, TROPONINI in the last 168 hours. BNP (last 3 results) No results for input(s): PROBNP in the last 8760 hours. HbA1C: No results for input(s): HGBA1C in the last 72 hours. CBG: No results for input(s): GLUCAP in the last 168 hours. Lipid Profile: No results for input(s): CHOL, HDL, LDLCALC, TRIG, CHOLHDL, LDLDIRECT in the last 72 hours. Thyroid Function Tests: No results for input(s): TSH, T4TOTAL, FREET4, T3FREE, THYROIDAB in the last 72 hours. Anemia Panel: No results for input(s): VITAMINB12, FOLATE, FERRITIN, TIBC, IRON, RETICCTPCT in the last 72 hours. Sepsis Labs: No results for input(s): PROCALCITON, LATICACIDVEN in the last 168 hours.  No results found for this or any previous visit (from the past 240 hour(s)).       Radiology Studies: No results found.      Scheduled Meds: . divalproex  500 mg Oral QHS  . enoxaparin (LOVENOX) injection  40 mg Subcutaneous Q24H  . famotidine  20 mg Oral QHS  . folic acid  1 mg Oral Daily  . LORazepam  0-4 mg Intravenous Q12H  . multivitamin with minerals  1 tablet Oral Daily  . thiamine  100 mg Oral Daily   Or  . thiamine  100 mg Intravenous Daily   Continuous Infusions: . sodium chloride 100 mL/hr at 08/14/17 1558  . sodium chloride 125 mL/hr at 08/16/17 0627     LOS: 2 days    Time spent: 35 minutes    Ramiro Harvestaniel , MD [facility] Pager [phone]  If 7PM-7AM, please contact night-coverage [email or web address] Password [email or web address] 08/16/2017, 7:02 PM

## 2017-08-17 LAB — CBC
HEMATOCRIT: 50.9 % (ref 39.0–52.0)
Hemoglobin: 18.1 g/dL — ABNORMAL HIGH (ref 13.0–17.0)
MCH: 30.6 pg (ref 26.0–34.0)
MCHC: 35.6 g/dL (ref 30.0–36.0)
MCV: 86.1 fL (ref 78.0–100.0)
PLATELETS: 216 10*3/uL (ref 150–400)
RBC: 5.91 MIL/uL — ABNORMAL HIGH (ref 4.22–5.81)
RDW: 14.3 % (ref 11.5–15.5)
WBC: 8 10*3/uL (ref 4.0–10.5)

## 2017-08-17 LAB — BASIC METABOLIC PANEL
ANION GAP: 11 (ref 5–15)
BUN: 10 mg/dL (ref 6–20)
CALCIUM: 8.6 mg/dL — AB (ref 8.9–10.3)
CO2: 24 mmol/L (ref 22–32)
Chloride: 104 mmol/L (ref 101–111)
Creatinine, Ser: 1.04 mg/dL (ref 0.61–1.24)
Glucose, Bld: 97 mg/dL (ref 65–99)
Potassium: 3.7 mmol/L (ref 3.5–5.1)
SODIUM: 139 mmol/L (ref 135–145)

## 2017-08-17 LAB — MAGNESIUM: MAGNESIUM: 2.1 mg/dL (ref 1.7–2.4)

## 2017-08-17 MED ORDER — LORAZEPAM 2 MG/ML IJ SOLN
0.0000 mg | Freq: Two times a day (BID) | INTRAMUSCULAR | Status: AC
  Administered 2017-08-17 – 2017-08-18 (×3): 1 mg via INTRAVENOUS
  Filled 2017-08-17 (×3): qty 1

## 2017-08-17 MED ORDER — ASPIRIN EC 81 MG PO TBEC
81.0000 mg | DELAYED_RELEASE_TABLET | Freq: Every day | ORAL | Status: DC
  Administered 2017-08-17 – 2017-08-18 (×2): 81 mg via ORAL
  Filled 2017-08-17 (×2): qty 1

## 2017-08-17 MED ORDER — FUROSEMIDE 10 MG/ML IJ SOLN
20.0000 mg | Freq: Once | INTRAMUSCULAR | Status: AC
  Administered 2017-08-17: 20 mg via INTRAVENOUS
  Filled 2017-08-17: qty 2

## 2017-08-17 NOTE — Progress Notes (Signed)
CSW met with patient to discuss mental health and substance abuse resources. CSW explained that resources have been added to patient's AVS so that all of his discharge and follow up appointments are in one location. CSW discussed [facility] walk-in appointments for psychiatry and mental health, as well as [facility] for outpatient alcohol rehabilitation and AA meetings and support groups. Patient acknowledged that he really wanted treatment for his depression, but was hoping to avoid [facility] as his ex-girlfriend used to work there and so he knows people in that office.   CSW conducted research into other office locations that would accept the patient under his insurance. CSW added additional information on [facility] outpatient clinic office as well as [place] outpatient mental health clinic. Patient will need to call and schedule an appointment; patient is aware.   Patient asked about a letter for when he's discharged to provide to his employer to document that he was here for the hospital, and asked that it respect his privacy and not include any specific medical information. CSW assured patient that the letter would only include that he was at the hospital and the dates of admission, no other information would be included. Patient also asked about what scripts he would be leaving the hospital with, and how to make sure he was able to get his psych medications in the interim between leaving the hospital and scheduling an outpatient follow up. CSW encouraged patient to make a psychiatrist appointment as soon as possible and see if his PCP would be willing to refill medications for him if needed prior to a psychiatry appointment.  CSW signing off.  Laveda Abbe, [place] Clinical Social Worker [phone]

## 2017-08-17 NOTE — Progress Notes (Signed)
PROGRESS NOTE    Brandon Foster  ZOX:[id number] DOB: 03/05/1956 DOA: 08/14/2017 PCP: Patient, No Pcp Per    Brief Narrative:  Brandon Foster is a 62 y.o. male with medical history significant of alcohol abuse, depression.  Patient reports that he has been drinking a 12 pack case every day for the last 3 to 4 days.  He reports being significantly depressed.  He states that he had thoughts of suicide but no plan for suicide.  He then decided that he wanted to have detox secondary to symptoms of shakiness.  He did not take anything for his withdrawal symptoms.  Symptoms have improved with Ativan in the emergency department.  ED Course: Vitals: Afebrile, tachycardic, normal respirations, normotensive, on room air Labs: Hemoglobin of 19.8, positive for THC. Imaging: None available Medications/Course: Ativan and normal saline      Assessment & Plan:   Principal Problem:   Depression Active Problems:   Alcohol withdrawal (HCC)   Alcohol abuse   Suicidal ideation  1 alcohol withdrawal Patient admitted placed on the Ativan withdrawal protocol.  Clinical improvement.  Continue Ativan withdrawal protocol, folic acid, thiamine, multivitamin.  Supportive care.  Mobilize.  2.  Depression/passive suicidal ideation Patient initially seen by counselor in the ED who had recommended inpatient admission.  Patient denies any suicidal ideation at this time.  Psychiatry reconsulted patient seen in consultation by Dr. Sharma CovertNorman who recommended patient be started on Depakote 500 mg nightly for mood stabilization.  Psychiatry recommending social work provide patient with resources for substance abuse treatment and local psychiatrist.  It is felt that patient is cleared psychiatry and does not need inpatient psychiatric treatment once stable.  Outpatient follow-up with psychiatry.  3.  Dehydration Improved with hydration.  Saline lock IV fluids.    4.  Polycythemia Hemoglobin currently at 18.1.  Placed on  aspirin 81 mg daily.  Outpatient follow-up with PCP may need referral to hematology in the outpatient setting.    DVT prophylaxis: Lovenox Code Status: Full Family Communication: Updated,  patient no family at bedside. Disposition Plan: Home once Ativan withdrawal protocol has been completed hopefully in 24 hours.    Consultants:   Psychiatry: Dr. Sharma CovertNorman 08/15/2017  Procedures:   None  Antimicrobials:   None   Subjective: Patient denies any chest pain.  No shortness of breath.  Tolerating oral intake.  Asking whether he can be placed on Lamictal instead of Depakote.    Objective: Vitals:   08/16/17 0521 08/16/17 1415 08/16/17 2145 08/17/17 0551  BP: (!) 133/97 (!) 148/93 (!) 142/100 (!) 131/93  Pulse: 80 86 74 80  Resp: 19 18 18 17   Temp: 98 F (36.7 C) 97.9 F (36.6 C) 97.9 F (36.6 C) 97.8 F (36.6 C)  TempSrc: Oral Oral Oral Oral  SpO2: 95% 93% 94% 95%  Weight:      Height:        Intake/Output Summary (Last 24 hours) at 08/17/2017 1403 Last data filed at 08/17/2017 1352 Gross per 24 hour  Intake 600 ml  Output 600 ml  Net 0 ml   Filed Weights   08/14/17 0646  Weight: 83.9 kg (185 lb)    Examination:  General exam: NAD. Respiratory system: CTAB.  No wheezes, no crackles, no rhonchi.  Cardiovascular system: RRR no murmurs rubs or gallops.  No lower extremity edema.  No JVD.   Gastrointestinal system: Abdomen is soft, nontender, nondistended, positive bowel sounds.  No rebound.  No guarding.   Central nervous system:  Alert and oriented. No focal neurological deficits. Extremities: Symmetric 5 x 5 power. Skin: No rashes, lesions or ulcers Psychiatry: Judgement and insight appear normal. Mood & affect appropriate.     Data Reviewed: I have personally reviewed following labs and imaging studies  CBC: Recent Labs  Lab 08/14/17 0633 08/15/17 0637 08/17/17 0533  WBC 10.3 8.0 8.0  HGB 19.8* 16.4 18.1*  HCT 55.5* 47.7 50.9  MCV 85.1 88.8 86.1  PLT  245 175 216   Basic Metabolic Panel: Recent Labs  Lab 08/14/17 0633 08/15/17 0637 08/17/17 0533  NA 138 140 139  K 4.2 4.0 3.7  CL 102 109 104  CO2 23 26 24   GLUCOSE 100* 87 97  BUN 7 14 10   CREATININE 0.98 1.01 1.04  CALCIUM 8.9 7.8* 8.6*  MG  --  2.0 2.1   GFR: Estimated Creatinine Clearance: 84.3 mL/min (by C-G formula based on SCr of 1.04 mg/dL). Liver Function Tests: Recent Labs  Lab 08/14/17 0633  AST 31  ALT 26  ALKPHOS 49  BILITOT 0.8  PROT 7.1  ALBUMIN 4.1   No results for input(s): LIPASE, AMYLASE in the last 168 hours. No results for input(s): AMMONIA in the last 168 hours. Coagulation Profile: Recent Labs  Lab 08/14/17 0710  INR 0.97   Cardiac Enzymes: No results for input(s): CKTOTAL, CKMB, CKMBINDEX, TROPONINI in the last 168 hours. BNP (last 3 results) No results for input(s): PROBNP in the last 8760 hours. HbA1C: No results for input(s): HGBA1C in the last 72 hours. CBG: No results for input(s): GLUCAP in the last 168 hours. Lipid Profile: No results for input(s): CHOL, HDL, LDLCALC, TRIG, CHOLHDL, LDLDIRECT in the last 72 hours. Thyroid Function Tests: No results for input(s): TSH, T4TOTAL, FREET4, T3FREE, THYROIDAB in the last 72 hours. Anemia Panel: No results for input(s): VITAMINB12, FOLATE, FERRITIN, TIBC, IRON, RETICCTPCT in the last 72 hours. Sepsis Labs: No results for input(s): PROCALCITON, LATICACIDVEN in the last 168 hours.  No results found for this or any previous visit (from the past 240 hour(s)).       Radiology Studies: No results found.      Scheduled Meds: . aspirin EC  81 mg Oral Daily  . divalproex  500 mg Oral QHS  . enoxaparin (LOVENOX) injection  40 mg Subcutaneous Q24H  . famotidine  20 mg Oral QHS  . folic acid  1 mg Oral Daily  . LORazepam  0-4 mg Intravenous Q12H  . multivitamin with minerals  1 tablet Oral Daily  . thiamine  100 mg Oral Daily   Or  . thiamine  100 mg Intravenous Daily    Continuous Infusions:    LOS: 3 days    Time spent: 35 minutes    Ramiro Harvest, MD [facility] Pager [phone]  If 7PM-7AM, please contact night-coverage [email or web address] Password [email or web address] 08/17/2017, 2:03 PM

## 2017-08-18 LAB — BASIC METABOLIC PANEL
ANION GAP: 13 (ref 5–15)
BUN: 18 mg/dL (ref 6–20)
CALCIUM: 8.9 mg/dL (ref 8.9–10.3)
CO2: 23 mmol/L (ref 22–32)
Chloride: 99 mmol/L — ABNORMAL LOW (ref 101–111)
Creatinine, Ser: 1.15 mg/dL (ref 0.61–1.24)
GFR calc Af Amer: >60 mL/min (ref >60)
GLUCOSE: 100 mg/dL — AB (ref 65–99)
Potassium: 3.6 mmol/L (ref 3.5–5.1)
Sodium: 135 mmol/L (ref 135–145)

## 2017-08-18 MED ORDER — POTASSIUM CHLORIDE CRYS ER 10 MEQ PO TBCR
40.0000 meq | EXTENDED_RELEASE_TABLET | Freq: Once | ORAL | Status: AC
  Administered 2017-08-18: 40 meq via ORAL
  Filled 2017-08-18: qty 4

## 2017-08-18 MED ORDER — THIAMINE HCL 100 MG PO TABS: 100.0000 mg | ORAL_TABLET | Freq: Every day | ORAL | Status: DC

## 2017-08-18 MED ORDER — FOLIC ACID 1 MG PO TABS
1.0000 mg | ORAL_TABLET | Freq: Every day | ORAL | Status: DC
Start: 2017-08-19 — End: 2023-02-01

## 2017-08-18 MED ORDER — FAMOTIDINE 20 MG PO TABS: 20.0000 mg | ORAL_TABLET | Freq: Every day | ORAL | 0 refills | Status: DC

## 2017-08-18 MED ORDER — LORAZEPAM 1 MG PO TABS
1.0000 mg | ORAL_TABLET | Freq: Once | ORAL | Status: AC
  Administered 2017-08-18: 1 mg via ORAL
  Filled 2017-08-18: qty 1

## 2017-08-18 MED ORDER — DIVALPROEX SODIUM ER 500 MG PO TB24: 500.0000 mg | ORAL_TABLET | Freq: Every day | ORAL | 0 refills | Status: DC

## 2017-08-18 NOTE — Discharge Summary (Signed)
Physician Discharge Summary  Brandon Foster YQM:[id number] DOB: 08/18/1955 DOA: 08/14/2017  PCP: Patient, No Pcp Per  Admit date: 08/14/2017 Discharge date: 08/18/2017  Time spent: 60 minutes  Recommendations for Outpatient Follow-up:  1. Follow-up with PCP in 2 to 3 weeks.  On follow-up patient will need a CBC done to follow-up on hemoglobin.  Patient will need a basic metabolic profile done to follow-up on electrolytes and renal function. 2. Follow-up with outpatient psychiatry.  Patient given resources on outpatient information for alcohol detox/cessation.   Discharge Diagnoses:  Principal Problem:   Depression Active Problems:   Alcohol withdrawal (HCC)   Alcohol abuse   Suicidal ideation   Discharge Condition: Stable and improved.  Diet recommendation: Regular  Filed Weights   08/14/17 0646 08/17/17 1410 08/18/17 0523  Weight: 83.9 kg (185 lb) 77.4 kg (170 lb 11.2 oz) 77.5 kg (170 lb 12.8 oz)    History of present illness:  Per Dr. Molli BarrowsNettey Brandon Foster is a 62 y.o. male with medical history significant of alcohol abuse, depression.  Patient reported that he had been drinking a 12 pack case every day for the last 3 to 4 days.  He reported being significantly depressed.  He stated that he had thoughts of suicide but no plan for suicide.  He then decided that he wanted to have detox secondary to symptoms of shakiness.  He did not take anything for his withdrawal symptoms.  Symptoms have improved with Ativan in the emergency department.  ED Course: Vitals: Afebrile, tachycardic, normal respirations, normotensive, on room air Labs: Hemoglobin of 19.8, positive for THC. Imaging: None available Medications/Course: Ativan and normal saline     Hospital Course:  1 alcohol withdrawal Patient admitted with alcohol withdrawal and placed on the Ativan withdrawal protocol.    Patient hydrated with IV fluids monitored.  Patient was also placed on folic acid, thiamine and multivitamin.   Patient improved clinically.  Patient completed the Ativan withdrawal protocol and given outpatient resources.  Patient will follow-up in the outpatient setting.   2.  Depression/passive suicidal ideation Patient initially seen by counselor in the ED who had recommended inpatient admission.  Patient denied any suicidal ideation during the rest of the hospitalization.   Psychiatry reconsulted patient seen in consultation by Dr. Sharma CovertNorman who recommended patient be started on Depakote 500 mg nightly for mood stabilization.  Psychiatry recommended social work provide patient with resources for substance abuse treatment and local psychiatrist.    Patient was cleared by psychiatry and did not need inpatient psychiatric treatment. Outpatient follow-up with psychiatry.  3.  Dehydration Patient was dehydrated on admission.  Patient was hydrated with IV fluids and was euvolemic by day of discharge.   4.  Polycythemia Hemoglobin remained stable. Patient maintained on aspirin. Outpatient follow-up with PCP may need referral to hematology in the outpatient setting.      Procedures:  None  Consultations:  Psychiatry: Dr. Sharma CovertNorman 08/15/2017      Discharge Exam: Vitals:   08/18/17 0523 08/18/17 1403  BP: 121/87 134/87  Pulse: 87 87  Resp: 19   Temp: (!) 97.5 F (36.4 C) 98.3 F (36.8 C)  SpO2: 94% 97%    General: NAD Cardiovascular: RRR Respiratory: CTAB  Discharge Instructions   Discharge Instructions    Diet general   Complete by:  As directed    Increase activity slowly   Complete by:  As directed      Allergies as of 08/18/2017   No Known Allergies  Medication List    TAKE these medications   aspirin 325 MG EC tablet Take 325 mg by mouth daily.   divalproex 500 MG 24 hr tablet Commonly known as:  DEPAKOTE ER Take 1 tablet (500 mg total) by mouth at bedtime.   famotidine 20 MG tablet Commonly known as:  PEPCID Take 1 tablet (20 mg total) by mouth at bedtime.    folic acid 1 MG tablet Commonly known as:  FOLVITE Take 1 tablet (1 mg total) by mouth daily. Start taking on:  08/19/2017   multivitamin tablet Take 1 tablet by mouth daily.   tadalafil 10 MG tablet Commonly known as:  CIALIS Use as directed What changed:    how much to take  how to take this  when to take this  reasons to take this  additional instructions   thiamine 100 MG tablet Take 1 tablet (100 mg total) by mouth daily. Start taking on:  08/19/2017      No Known Allergies Follow-up Information    [facility]. Go to.   Specialty:  Behavioral Health Why:  Walk-in appointments for outpatient therapy and psychiatry. Available Monday through Friday, starting at 8:30 am. First come, first serve. Arrive early for same day treatment.  Contact information: [address] [place] [address] [phone]        [facility]. Call.   Specialty:  Behavioral Health Why:  Services for outpatient alcohol abuse treatment. Call to schedule an appointment. Offers both individual and group treatment sessions.  Contact information: [address] [place] [address] [phone]        [facility]. Call.   Specialty:  Behavioral Health Why:  Please call to ask about scheduling an appointment for outpatient psychiatry or therapy. Should accept [employer]. Contact information: [address] [place] [address] [phone]       [facility]. Call.   Specialty:  Behavioral Health Why:  Please call to ask about scheduling an appointment for outpatient psychiatry or therapy. Should accept [employer]. Contact information: [address] [place] [address] [phone]       pcp. Schedule an appointment as soon as possible for a visit in 2 week(s).   Why:  f/u in 2-3 weeks.           The results of significant diagnostics  from this hospitalization (including imaging, microbiology, ancillary and laboratory) are listed below for reference.    Significant Diagnostic Studies: No results found.  Microbiology: No results found for this or any previous visit (from the past 240 hour(s)).   Labs: Basic Metabolic Panel: Recent Labs  Lab 08/14/17 0633 08/15/17 0637 08/17/17 0533 08/18/17 0632  NA 138 140 139 135  K 4.2 4.0 3.7 3.6  CL 102 109 104 99*  CO2 23 26 24 23   GLUCOSE 100* 87 97 100*  BUN 7 14 10 18   CREATININE 0.98 1.01 1.04 1.15  CALCIUM 8.9 7.8* 8.6* 8.9  MG  --  2.0 2.1  --    Liver Function Tests: Recent Labs  Lab 08/14/17 0633  AST 31  ALT 26  ALKPHOS 49  BILITOT 0.8  PROT 7.1  ALBUMIN 4.1   No results for input(s): LIPASE, AMYLASE in the last 168 hours. No results for input(s): AMMONIA in the last 168 hours. CBC: Recent Labs  Lab 08/14/17 0633 08/15/17 0637 08/17/17 0533  WBC 10.3 8.0 8.0  HGB 19.8* 16.4 18.1*  HCT 55.5* 47.7 50.9  MCV 85.1 88.8 86.1  PLT 245 175 216   Cardiac Enzymes: No results for input(s): CKTOTAL, CKMB, CKMBINDEX, TROPONINI in the last 168 hours. BNP: BNP (last 3 results) No results for input(s): BNP in the last 8760 hours.  ProBNP (last 3 results) No results for input(s): PROBNP in the last 8760 hours.  CBG: No results for input(s): GLUCAP in the last 168 hours.     Signed:  Ramiro Harvest MD.  [facility] 08/18/2017, 5:08 PM

## 2019-04-02 IMAGING — MR MR LUMBAR SPINE W/O CM
5 series · 45 of 48 positions shown · non-contrast
Comparison: None.

CLINICAL DATA: Chronic worsening low back pain radiating to both
legs. Numbness. Symptoms for 6 months.

EXAM:
MRI LUMBAR SPINE WITHOUT CONTRAST
TECHNIQUE: Multiplanar, multisequence MR imaging of the lumbar spine was
performed. No intravenous contrast was administered.

[Series 3: T2 · sagittal · 4.0mm · 0.94mm/px · 6 of 13 slices shown (1 of 2)]
[im 1/13]
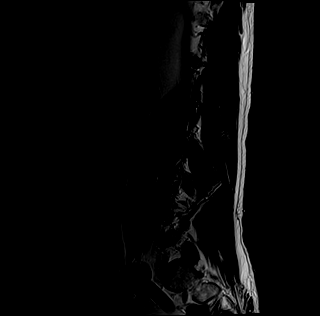
[im 3/13]
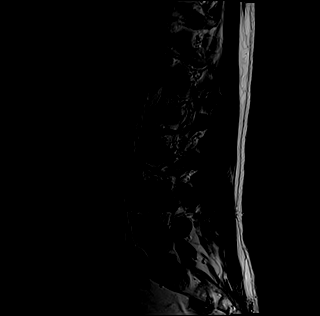
[im 5/13]
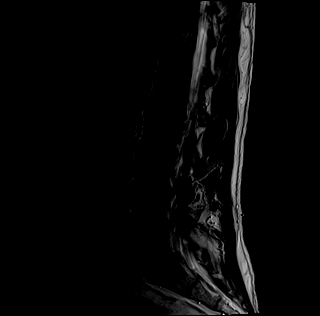
[im 8/13]
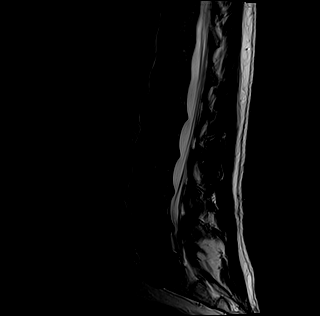
[im 10/13]
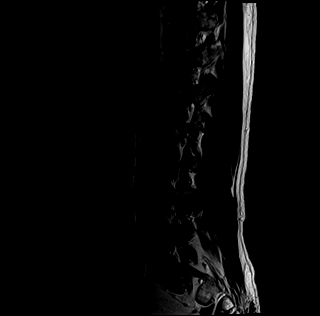
[im 13/13]
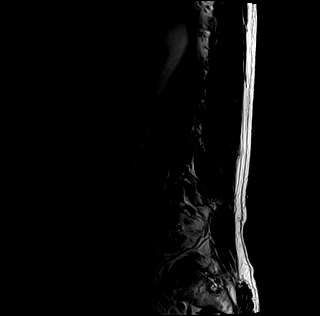

[Series 4: T1 · sagittal · 4.0mm · 0.94mm/px · 6 of 13 slices shown (1 of 2)]
[im 1/13]
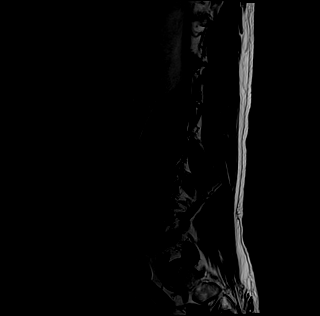
[im 3/13]
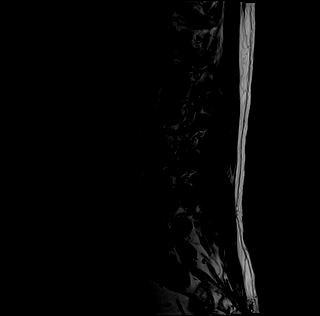
[im 5/13]
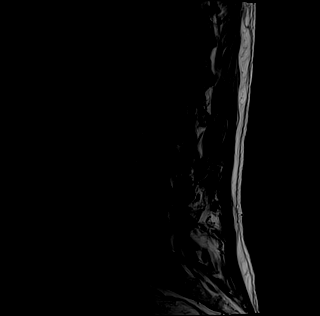
[im 8/13]
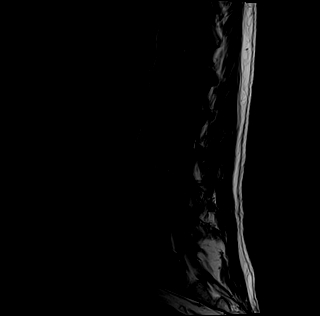
[im 10/13]
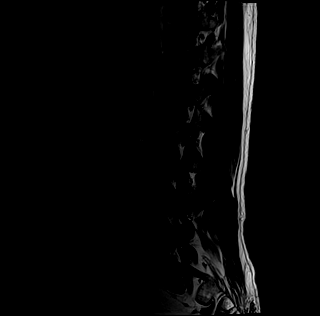
[im 13/13]
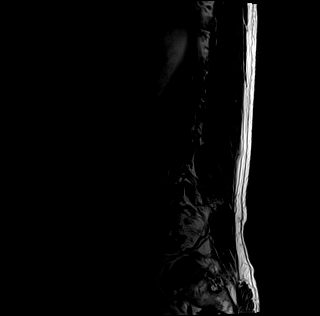

[Series 5: tirm sag · sagittal · 4.0mm · 0.59mm/px · 6 of 13 slices shown]
[im 1/13]
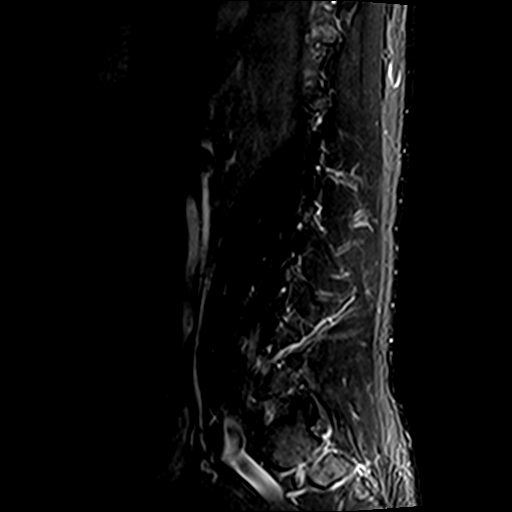
[im 3/13]
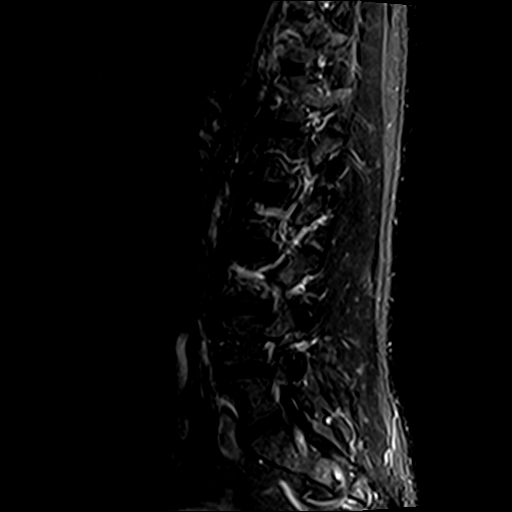
[im 5/13]
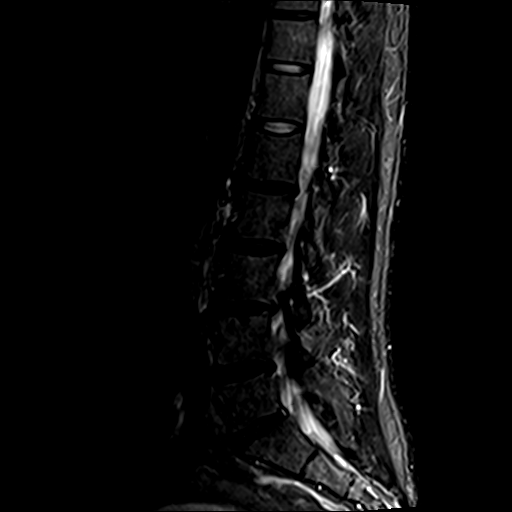
[im 8/13]
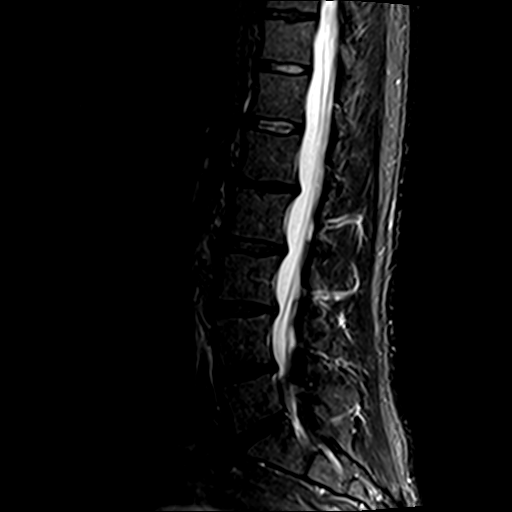
[im 10/13]
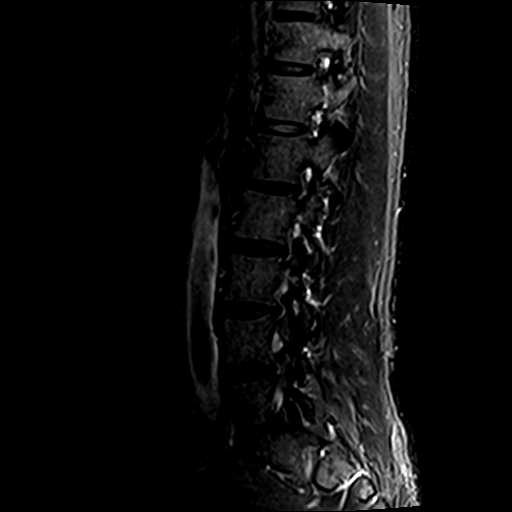
[im 13/13]
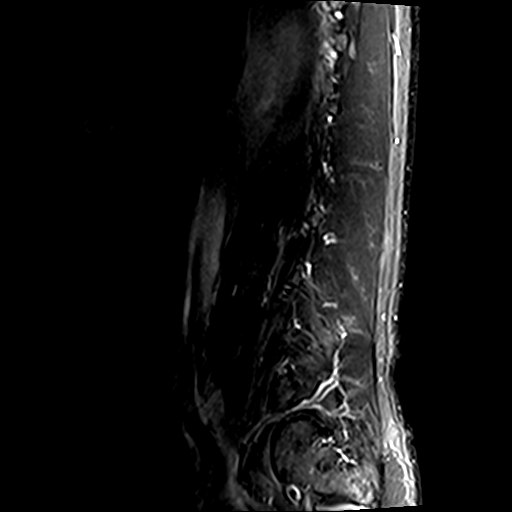

[Series 6: T1 · axial · 4.0mm · 0.78mm/px · z∈[-65,+121]mm · 12 of 33 slices shown (2 of 2)]
[im 1/33]
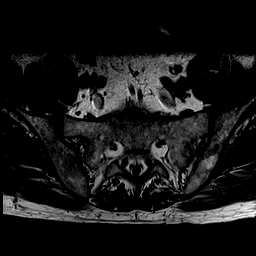
[im 3/33]
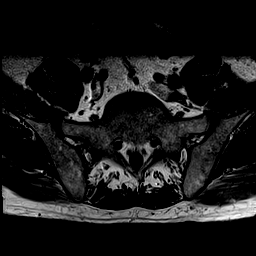
[im 5/33]
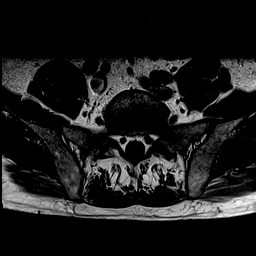
[im 7/33]
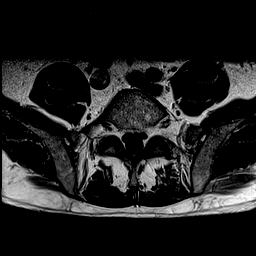
[im 10/33]
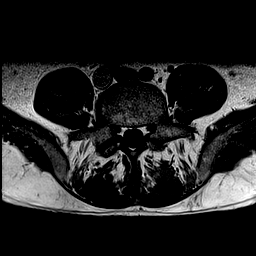
[im 12/33]
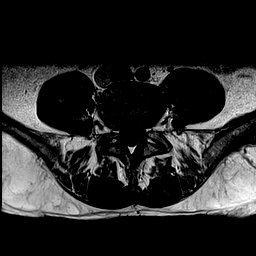
[im 14/33]
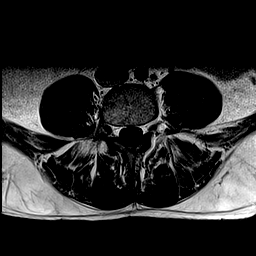
[im 17/33]
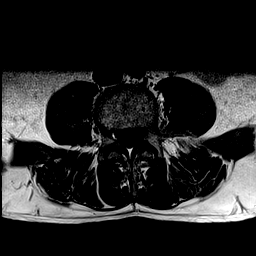
[im 19/33]
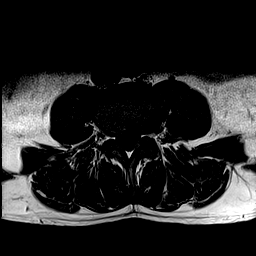
[im 23/33]
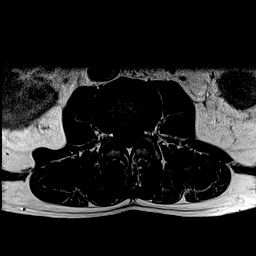
[im 28/33]
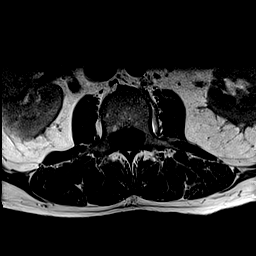
[im 33/33]
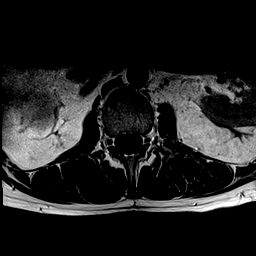

[Series 7: T2 · axial · 4.0mm · 0.78mm/px · z∈[-65,+121]mm · 15 of 33 slices shown (2 of 2)]
[im 1/33]
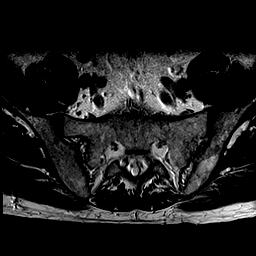
[im 3/33]
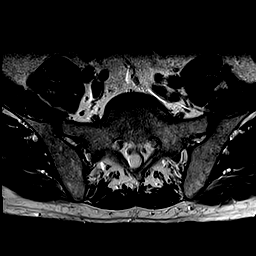
[im 5/33]
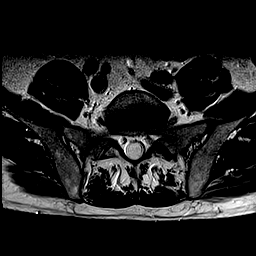
[im 7/33]
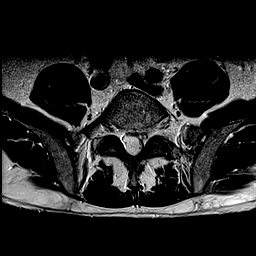
[im 10/33]
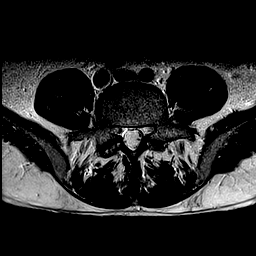
[im 12/33]
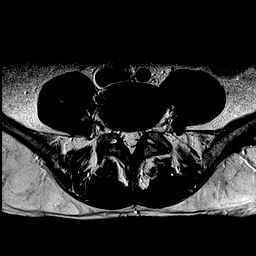
[im 14/33]
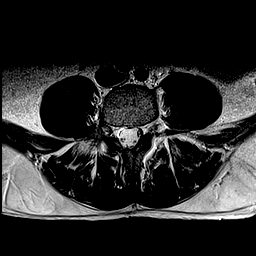
[im 17/33]
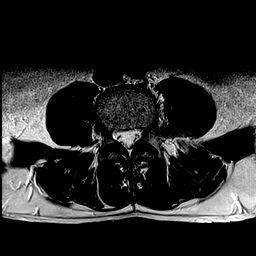
[im 19/33]
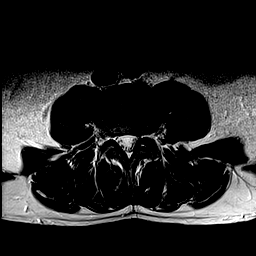
[im 21/33]
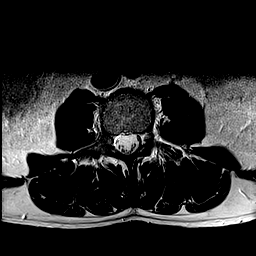
[im 23/33]
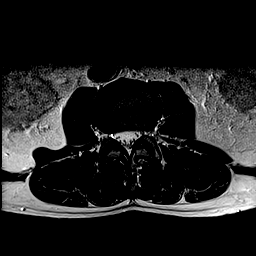
[im 26/33]
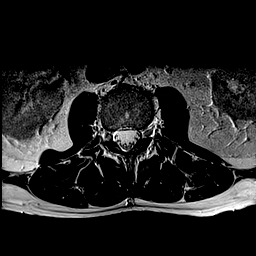
[im 28/33]
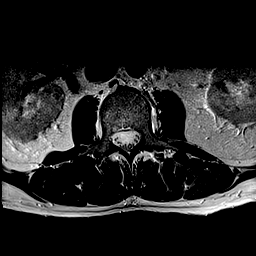
[im 30/33]
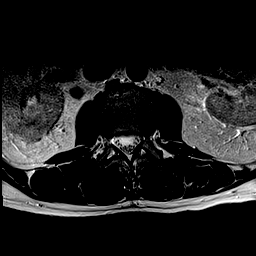
[im 33/33]
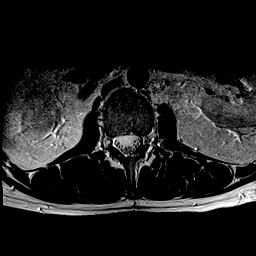

[45 of 48 positions shown; findings below may reference images not displayed]

FINDINGS: Segmentation:  Standard.

Alignment:  Physiologic.

Vertebrae:  No fracture, evidence of discitis, or bone lesion.

Conus medullaris: Extends to the L1 level and appears normal.

Paraspinal and other soft tissues: Unremarkable.

Disc levels:

L1-L2: Moderate disc space narrowing. Central protrusion. No
impingement.

L2-L3: Preserved interspace height. Slight desiccation. Annular
bulge. Facet arthropathy. No impingement.

L3-L4: Mild disc space narrowing. Shallow central and leftward
protrusion. Posterior element hypertrophy. Subarticular zone
narrowing could affect the LEFT L4 nerve root.

L4-L5: Mild disc space narrowing. Central protrusion. Facet
arthropathy. No definite impingement.

L5-S1: Preserved interspace height. Slight desiccation. Central
protrusion. No impingement.
IMPRESSION: Mild multilevel spondylosis. No significant spinal stenosis or
dominant disc extrusion.

## 2023-02-01 ENCOUNTER — Inpatient Hospital Stay (HOSPITAL_COMMUNITY)
Admission: EM | Admit: 2023-02-01 | Discharge: 2023-02-04 | DRG: 321 | Disposition: A | Discharge status: Home / Self Care | Payer: Medicare Other | Source: Other Acute Inpatient Hospital | Attending: Cardiology | Admitting: Cardiology

## 2023-02-01 ENCOUNTER — Other Ambulatory Visit: Payer: Self-pay

## 2023-02-01 ENCOUNTER — Encounter (HOSPITAL_COMMUNITY)
Admission: EM | Disposition: A | Discharge status: Home / Self Care | Payer: Self-pay | Source: Other Acute Inpatient Hospital | Attending: Cardiology

## 2023-02-01 DIAGNOSIS — Z5971 Insufficient health insurance coverage: Secondary | ICD-10-CM | POA: Diagnosis not present

## 2023-02-01 DIAGNOSIS — Z811 Family history of alcohol abuse and dependence: Secondary | ICD-10-CM | POA: Diagnosis not present

## 2023-02-01 DIAGNOSIS — Z955 Presence of coronary angioplasty implant and graft: Secondary | ICD-10-CM

## 2023-02-01 DIAGNOSIS — T82855A Stenosis of coronary artery stent, initial encounter: Secondary | ICD-10-CM | POA: Diagnosis present

## 2023-02-01 DIAGNOSIS — Y831 Surgical operation with implant of artificial internal device as the cause of abnormal reaction of the patient, or of later complication, without mention of misadventure at the time of the procedure: Secondary | ICD-10-CM | POA: Diagnosis present

## 2023-02-01 DIAGNOSIS — I255 Ischemic cardiomyopathy: Secondary | ICD-10-CM | POA: Diagnosis present

## 2023-02-01 DIAGNOSIS — Z7982 Long term (current) use of aspirin: Secondary | ICD-10-CM | POA: Diagnosis not present

## 2023-02-01 DIAGNOSIS — I5021 Acute systolic (congestive) heart failure: Secondary | ICD-10-CM | POA: Diagnosis not present

## 2023-02-01 DIAGNOSIS — I9788 Other intraoperative complications of the circulatory system, not elsewhere classified: Secondary | ICD-10-CM | POA: Diagnosis not present

## 2023-02-01 DIAGNOSIS — I213 ST elevation (STEMI) myocardial infarction of unspecified site: Secondary | ICD-10-CM | POA: Diagnosis not present

## 2023-02-01 DIAGNOSIS — I340 Nonrheumatic mitral (valve) insufficiency: Secondary | ICD-10-CM | POA: Diagnosis present

## 2023-02-01 DIAGNOSIS — I252 Old myocardial infarction: Secondary | ICD-10-CM | POA: Diagnosis not present

## 2023-02-01 DIAGNOSIS — Z8249 Family history of ischemic heart disease and other diseases of the circulatory system: Secondary | ICD-10-CM

## 2023-02-01 DIAGNOSIS — T82817A Embolism of cardiac prosthetic devices, implants and grafts, initial encounter: Secondary | ICD-10-CM | POA: Diagnosis not present

## 2023-02-01 DIAGNOSIS — R570 Cardiogenic shock: Secondary | ICD-10-CM | POA: Diagnosis present

## 2023-02-01 DIAGNOSIS — I25119 Atherosclerotic heart disease of native coronary artery with unspecified angina pectoris: Secondary | ICD-10-CM | POA: Diagnosis not present

## 2023-02-01 DIAGNOSIS — I5020 Unspecified systolic (congestive) heart failure: Secondary | ICD-10-CM | POA: Insufficient documentation

## 2023-02-01 DIAGNOSIS — I2111 ST elevation (STEMI) myocardial infarction involving right coronary artery: Principal | ICD-10-CM | POA: Diagnosis present

## 2023-02-01 DIAGNOSIS — F1010 Alcohol abuse, uncomplicated: Secondary | ICD-10-CM | POA: Diagnosis present

## 2023-02-01 DIAGNOSIS — I2119 ST elevation (STEMI) myocardial infarction involving other coronary artery of inferior wall: Secondary | ICD-10-CM | POA: Diagnosis present

## 2023-02-01 DIAGNOSIS — E785 Hyperlipidemia, unspecified: Secondary | ICD-10-CM | POA: Insufficient documentation

## 2023-02-01 DIAGNOSIS — T8119XA Other postprocedural shock, initial encounter: Secondary | ICD-10-CM | POA: Diagnosis not present

## 2023-02-01 DIAGNOSIS — Z87891 Personal history of nicotine dependence: Secondary | ICD-10-CM | POA: Diagnosis not present

## 2023-02-01 DIAGNOSIS — I25110 Atherosclerotic heart disease of native coronary artery with unstable angina pectoris: Secondary | ICD-10-CM | POA: Diagnosis not present

## 2023-02-01 DIAGNOSIS — I2510 Atherosclerotic heart disease of native coronary artery without angina pectoris: Secondary | ICD-10-CM

## 2023-02-01 DIAGNOSIS — Z79899 Other long term (current) drug therapy: Secondary | ICD-10-CM | POA: Diagnosis not present

## 2023-02-01 HISTORY — PX: LEFT HEART CATH AND CORONARY ANGIOGRAPHY: CATH118249

## 2023-02-01 HISTORY — PX: CORONARY/GRAFT ACUTE MI REVASCULARIZATION: CATH118305

## 2023-02-01 HISTORY — PX: CORONARY STENT INTERVENTION: CATH118234

## 2023-02-01 LAB — LIPID PANEL
Cholesterol: 240 mg/dL — ABNORMAL HIGH (ref 0–200)
HDL: 36 mg/dL — ABNORMAL LOW (ref >40)
LDL Cholesterol: 177 mg/dL — ABNORMAL HIGH (ref 0–99)
Total CHOL/HDL Ratio: 6.7 {ratio}
Triglycerides: 133 mg/dL (ref <150)
VLDL: 27 mg/dL (ref 0–40)

## 2023-02-01 LAB — CBC
HCT: 50.8 % (ref 39.0–52.0)
Hemoglobin: 17.8 g/dL — ABNORMAL HIGH (ref 13.0–17.0)
MCH: 29 pg (ref 26.0–34.0)
MCHC: 35 g/dL (ref 30.0–36.0)
MCV: 82.9 fL (ref 80.0–100.0)
Platelets: 309 10*3/uL (ref 150–400)
RBC: 6.13 MIL/uL — ABNORMAL HIGH (ref 4.22–5.81)
RDW: 14.4 % (ref 11.5–15.5)
WBC: 13.8 10*3/uL — ABNORMAL HIGH (ref 4.0–10.5)
nRBC: 0 % (ref 0.0–0.2)

## 2023-02-01 LAB — POCT I-STAT, CHEM 8
BUN: 22 mg/dL (ref 8–23)
Calcium, Ion: 1.13 mmol/L — ABNORMAL LOW (ref 1.15–1.40)
Chloride: 103 mmol/L (ref 98–111)
Creatinine, Ser: 1.2 mg/dL (ref 0.61–1.24)
Glucose, Bld: 128 mg/dL — ABNORMAL HIGH (ref 70–99)
HCT: 53 % — ABNORMAL HIGH (ref 39.0–52.0)
Hemoglobin: 18 g/dL — ABNORMAL HIGH (ref 13.0–17.0)
Potassium: 3.5 mmol/L (ref 3.5–5.1)
Sodium: 139 mmol/L (ref 135–145)
TCO2: 26 mmol/L (ref 22–32)

## 2023-02-01 LAB — COMPREHENSIVE METABOLIC PANEL
ALT: 33 U/L (ref 0–44)
AST: 26 U/L (ref 15–41)
Albumin: 3.4 g/dL — ABNORMAL LOW (ref 3.5–5.0)
Alkaline Phosphatase: 47 U/L (ref 38–126)
Anion gap: 14 (ref 5–15)
BUN: 20 mg/dL (ref 8–23)
CO2: 20 mmol/L — ABNORMAL LOW (ref 22–32)
Calcium: 8.9 mg/dL (ref 8.9–10.3)
Chloride: 103 mmol/L (ref 98–111)
Creatinine, Ser: 1.35 mg/dL — ABNORMAL HIGH (ref 0.61–1.24)
GFR, Estimated: 58 mL/min — ABNORMAL LOW (ref >60)
Glucose, Bld: 121 mg/dL — ABNORMAL HIGH (ref 70–99)
Potassium: 3.6 mmol/L (ref 3.5–5.1)
Sodium: 137 mmol/L (ref 135–145)
Total Bilirubin: 0.7 mg/dL (ref <1.2)
Total Protein: 6.5 g/dL (ref 6.5–8.1)

## 2023-02-01 LAB — HEMOGLOBIN A1C
Hgb A1c MFr Bld: 5.4 % (ref 4.8–5.6)
Mean Plasma Glucose: 108.28 mg/dL

## 2023-02-01 LAB — APTT: aPTT: 29 s (ref 24–36)

## 2023-02-01 LAB — POCT ACTIVATED CLOTTING TIME
Activated Clotting Time: 239 s
Activated Clotting Time: 348 s

## 2023-02-01 LAB — PROTIME-INR
INR: 1 (ref 0.8–1.2)
Prothrombin Time: 13 s (ref 11.4–15.2)

## 2023-02-01 LAB — TROPONIN I (HIGH SENSITIVITY): Troponin I (High Sensitivity): 38 ng/L — ABNORMAL HIGH (ref <18)

## 2023-02-01 LAB — CG4 I-STAT (LACTIC ACID): Lactic Acid, Venous: 1.8 mmol/L (ref 0.5–1.9)

## 2023-02-01 SURGERY — CORONARY/GRAFT ACUTE MI REVASCULARIZATION
Anesthesia: LOCAL

## 2023-02-01 MED ORDER — FAMOTIDINE 20 MG PO TABS
20.0000 mg | ORAL_TABLET | Freq: Every day | ORAL | Status: DC
  Administered 2023-02-01 – 2023-02-03 (×3): 20 mg via ORAL
  Filled 2023-02-01 (×3): qty 1

## 2023-02-01 MED ORDER — PHENYLEPHRINE HCL-NACL 20-0.9 MG/250ML-% IV SOLN
INTRAVENOUS | Status: AC | PRN
  Administered 2023-02-01: 240 ug via INTRAVENOUS

## 2023-02-01 MED ORDER — TIROFIBAN HCL IN NACL 5-0.9 MG/100ML-% IV SOLN
INTRAVENOUS | Status: AC
  Filled 2023-02-01: qty 100

## 2023-02-01 MED ORDER — SODIUM CHLORIDE 0.9% FLUSH
3.0000 mL | INTRAVENOUS | Status: DC | PRN
Start: 2023-02-01 — End: 2023-02-04

## 2023-02-01 MED ORDER — FOLIC ACID 1 MG PO TABS
1.0000 mg | ORAL_TABLET | Freq: Every day | ORAL | Status: DC
  Administered 2023-02-02 – 2023-02-04 (×3): 1 mg via ORAL
  Filled 2023-02-01 (×3): qty 1

## 2023-02-01 MED ORDER — HEPARIN SODIUM (PORCINE) 1000 UNIT/ML IJ SOLN
INTRAMUSCULAR | Status: DC | PRN
  Administered 2023-02-01: 8000 [IU] via INTRAVENOUS
  Administered 2023-02-01: 3000 [IU] via INTRAVENOUS

## 2023-02-01 MED ORDER — FENTANYL CITRATE (PF) 100 MCG/2ML IJ SOLN
INTRAMUSCULAR | Status: DC | PRN
  Administered 2023-02-01 (×3): 25 ug via INTRAVENOUS

## 2023-02-01 MED ORDER — LIDOCAINE HCL (PF) 1 % IJ SOLN
INTRAMUSCULAR | Status: DC | PRN
  Administered 2023-02-01: 5 mL via INTRADERMAL

## 2023-02-01 MED ORDER — LABETALOL HCL 5 MG/ML IV SOLN
10.0000 mg | INTRAVENOUS | Status: AC | PRN
Start: 2023-02-01 — End: 2023-02-02

## 2023-02-01 MED ORDER — CANGRELOR BOLUS VIA INFUSION
INTRAVENOUS | Status: DC | PRN
  Administered 2023-02-01: 2430 ug via INTRAVENOUS

## 2023-02-01 MED ORDER — MORPHINE SULFATE (PF) 2 MG/ML IV SOLN
2.0000 mg | INTRAVENOUS | Status: DC | PRN
  Administered 2023-02-03: 2 mg via INTRAVENOUS

## 2023-02-01 MED ORDER — ONDANSETRON HCL 4 MG/2ML IJ SOLN
4.0000 mg | Freq: Four times a day (QID) | INTRAMUSCULAR | Status: DC | PRN
  Administered 2023-02-02: 4 mg via INTRAVENOUS
  Filled 2023-02-01: qty 2

## 2023-02-01 MED ORDER — TIROFIBAN HCL IN NACL 5-0.9 MG/100ML-% IV SOLN
0.1500 ug/kg/min | INTRAVENOUS | Status: AC
  Administered 2023-02-01 (×2): 0.15 ug/kg/min via INTRAVENOUS
  Filled 2023-02-01: qty 100

## 2023-02-01 MED ORDER — SODIUM CHLORIDE 0.9 % IV SOLN: INTRAVENOUS | Status: AC

## 2023-02-01 MED ORDER — ACETAMINOPHEN 325 MG PO TABS
650.0000 mg | ORAL_TABLET | ORAL | Status: DC | PRN
  Administered 2023-02-01 – 2023-02-04 (×5): 650 mg via ORAL
  Filled 2023-02-01 (×5): qty 2

## 2023-02-01 MED ORDER — SODIUM CHLORIDE 0.9 % IV SOLN: 250.0000 mL | INTRAVENOUS | Status: DC

## 2023-02-01 MED ORDER — HEPARIN SODIUM (PORCINE) 1000 UNIT/ML IJ SOLN
INTRAMUSCULAR | Status: AC
  Filled 2023-02-01: qty 10

## 2023-02-01 MED ORDER — HEPARIN (PORCINE) 25000 UT/250ML-% IV SOLN
1400.0000 [IU]/h | INTRAVENOUS | Status: DC
  Administered 2023-02-02: 1150 [IU]/h via INTRAVENOUS
  Administered 2023-02-02: 1300 [IU]/h via INTRAVENOUS
  Filled 2023-02-01 (×2): qty 250

## 2023-02-01 MED ORDER — TICAGRELOR 90 MG PO TABS
ORAL_TABLET | ORAL | Status: DC | PRN
  Administered 2023-02-01: 180 mg via ORAL

## 2023-02-01 MED ORDER — TIROFIBAN HCL IN NACL 5-0.9 MG/100ML-% IV SOLN
INTRAVENOUS | Status: AC | PRN
  Administered 2023-02-01: .15 ug/kg/min via INTRAVENOUS

## 2023-02-01 MED ORDER — NOREPINEPHRINE 4 MG/250ML-% IV SOLN
2.0000 ug/min | INTRAVENOUS | Status: DC
  Administered 2023-02-01: 20 ug/min via INTRAVENOUS

## 2023-02-01 MED ORDER — SODIUM CHLORIDE 0.9 % IV BOLUS
INTRAVENOUS | Status: DC | PRN
  Administered 2023-02-01: 500 mL via INTRAVENOUS

## 2023-02-01 MED ORDER — NOREPINEPHRINE 4 MG/250ML-% IV SOLN
2.0000 ug/min | INTRAVENOUS | Status: DC
Start: 2023-02-01 — End: 2023-02-01

## 2023-02-01 MED ORDER — TIROFIBAN (AGGRASTAT) BOLUS VIA INFUSION
INTRAVENOUS | Status: DC | PRN
  Administered 2023-02-01: 2025 ug via INTRAVENOUS

## 2023-02-01 MED ORDER — VERAPAMIL HCL 2.5 MG/ML IV SOLN
INTRAVENOUS | Status: DC | PRN
  Administered 2023-02-01: 10 mL via INTRA_ARTERIAL

## 2023-02-01 MED ORDER — CANGRELOR TETRASODIUM 50 MG IV SOLR
INTRAVENOUS | Status: AC
  Filled 2023-02-01: qty 50

## 2023-02-01 MED ORDER — ADULT MULTIVITAMIN W/MINERALS CH
1.0000 | ORAL_TABLET | Freq: Every day | ORAL | Status: DC
  Administered 2023-02-02 – 2023-02-04 (×3): 1 via ORAL
  Filled 2023-02-01 (×3): qty 1

## 2023-02-01 MED ORDER — LIDOCAINE HCL (PF) 1 % IJ SOLN
INTRAMUSCULAR | Status: AC
  Filled 2023-02-01: qty 30

## 2023-02-01 MED ORDER — HYDRALAZINE HCL 20 MG/ML IJ SOLN
10.0000 mg | INTRAMUSCULAR | Status: AC | PRN
Start: 2023-02-01 — End: 2023-02-02

## 2023-02-01 MED ORDER — NOREPINEPHRINE 4 MG/250ML-% IV SOLN
INTRAVENOUS | Status: AC
  Filled 2023-02-01: qty 250

## 2023-02-01 MED ORDER — TICAGRELOR 90 MG PO TABS
ORAL_TABLET | ORAL | Status: AC
  Filled 2023-02-01: qty 2

## 2023-02-01 MED ORDER — THIAMINE MONONITRATE 100 MG PO TABS
100.0000 mg | ORAL_TABLET | Freq: Every day | ORAL | Status: DC
  Administered 2023-02-02 – 2023-02-04 (×3): 100 mg via ORAL
  Filled 2023-02-01 (×5): qty 1

## 2023-02-01 MED ORDER — SODIUM CHLORIDE 0.9 % IV SOLN
250.0000 mL | INTRAVENOUS | Status: DC | PRN
Start: 2023-02-01 — End: 2023-02-02

## 2023-02-01 MED ORDER — VERAPAMIL HCL 2.5 MG/ML IV SOLN
INTRAVENOUS | Status: AC
  Filled 2023-02-01: qty 2

## 2023-02-01 MED ORDER — ASPIRIN 81 MG PO CHEW
81.0000 mg | CHEWABLE_TABLET | Freq: Every day | ORAL | Status: DC
Start: 2023-02-02 — End: 2023-02-04
  Administered 2023-02-02 – 2023-02-04 (×3): 81 mg via ORAL
  Filled 2023-02-01 (×3): qty 1

## 2023-02-01 MED ORDER — MIDAZOLAM HCL 2 MG/2ML IJ SOLN
INTRAMUSCULAR | Status: AC
  Filled 2023-02-01: qty 2

## 2023-02-01 MED ORDER — FENTANYL CITRATE (PF) 100 MCG/2ML IJ SOLN
INTRAMUSCULAR | Status: AC
  Filled 2023-02-01: qty 2

## 2023-02-01 MED ORDER — ATORVASTATIN CALCIUM 80 MG PO TABS
80.0000 mg | ORAL_TABLET | Freq: Every day | ORAL | Status: DC
Start: 2023-02-02 — End: 2023-02-04
  Filled 2023-02-01: qty 1

## 2023-02-01 MED ORDER — SODIUM CHLORIDE 0.9 % IV SOLN
INTRAVENOUS | Status: AC | PRN
  Administered 2023-02-01: 4 ug/kg/min via INTRAVENOUS

## 2023-02-01 MED ORDER — SODIUM CHLORIDE 0.9% FLUSH
3.0000 mL | Freq: Two times a day (BID) | INTRAVENOUS | Status: DC
  Administered 2023-02-01 – 2023-02-03 (×3): 3 mL via INTRAVENOUS

## 2023-02-01 MED ORDER — NOREPINEPHRINE BITARTRATE 1 MG/ML IV SOLN
INTRAVENOUS | Status: AC | PRN
  Administered 2023-02-01: 20 ug/min via INTRAVENOUS

## 2023-02-01 MED ORDER — PHENYLEPHRINE 80 MCG/ML (10ML) SYRINGE FOR IV PUSH (FOR BLOOD PRESSURE SUPPORT)
PREFILLED_SYRINGE | INTRAVENOUS | Status: DC | PRN
  Administered 2023-02-01: 240 ug via INTRAVENOUS

## 2023-02-01 MED ORDER — PHENYLEPHRINE 80 MCG/ML (10ML) SYRINGE FOR IV PUSH (FOR BLOOD PRESSURE SUPPORT)
PREFILLED_SYRINGE | INTRAVENOUS | Status: AC
  Filled 2023-02-01: qty 10

## 2023-02-01 MED ORDER — TICAGRELOR 90 MG PO TABS
90.0000 mg | ORAL_TABLET | Freq: Two times a day (BID) | ORAL | Status: DC
  Administered 2023-02-02 – 2023-02-04 (×5): 90 mg via ORAL
  Filled 2023-02-01 (×5): qty 1

## 2023-02-01 MED ORDER — TRAZODONE HCL 50 MG PO TABS
50.0000 mg | ORAL_TABLET | Freq: Once | ORAL | Status: DC
  Filled 2023-02-01: qty 1

## 2023-02-01 MED ORDER — HEPARIN (PORCINE) IN NACL 1000-0.9 UT/500ML-% IV SOLN
INTRAVENOUS | Status: DC | PRN
  Administered 2023-02-01 (×2): 500 mL

## 2023-02-01 MED ORDER — MIDAZOLAM HCL 2 MG/2ML IJ SOLN
INTRAMUSCULAR | Status: DC | PRN
  Administered 2023-02-01: 1 mg via INTRAVENOUS

## 2023-02-01 MED ORDER — CHLORHEXIDINE GLUCONATE CLOTH 2 % EX PADS
6.0000 | MEDICATED_PAD | Freq: Every day | CUTANEOUS | Status: DC
  Administered 2023-02-02 – 2023-02-04 (×3): 6 via TOPICAL

## 2023-02-01 MED ORDER — IOHEXOL 350 MG/ML SOLN
INTRAVENOUS | Status: DC | PRN
  Administered 2023-02-01: 95 mL

## 2023-02-01 MED ORDER — ORAL CARE MOUTH RINSE: 15.0000 mL | OROMUCOSAL | Status: DC | PRN

## 2023-02-01 SURGICAL SUPPLY — 18 items
BALLN EMERGE MR 2.5X12 (BALLOONS) ×1
BALLN ~~LOC~~ EMERGE MR 3.0X20 (BALLOONS) ×1
BALLOON EMERGE MR 2.5X12 (BALLOONS) IMPLANT
BALLOON ~~LOC~~ EMERGE MR 3.0X20 (BALLOONS) IMPLANT
CATH 5FR JL3.5 JR4 ANG PIG MP (CATHETERS) IMPLANT
CATH INFINITI AMBI 5FR TG (CATHETERS) IMPLANT
CATH LAUNCHER 6FR AL.75 (CATHETERS) IMPLANT
DEVICE RAD COMP TR BAND LRG (VASCULAR PRODUCTS) IMPLANT
GLIDESHEATH SLEND SS 6F .021 (SHEATH) IMPLANT
GUIDEWIRE INQWIRE 1.5J.035X260 (WIRE) IMPLANT
INQWIRE 1.5J .035X260CM (WIRE) ×1
KIT ENCORE 26 ADVANTAGE (KITS) IMPLANT
PACK CARDIAC CATHETERIZATION (CUSTOM PROCEDURE TRAY) ×1 IMPLANT
SET ATX-X65L (MISCELLANEOUS) IMPLANT
STENT SYNERGY XD 2.75X24 (Permanent Stent) IMPLANT
SYNERGY XD 2.75X24 (Permanent Stent) ×1 IMPLANT
TUBING CIL FLEX 10 FLL-RA (TUBING) IMPLANT
WIRE ASAHI PROWATER 180CM (WIRE) IMPLANT

## 2023-02-01 NOTE — Brief Op Note (Signed)
02/01/2023  7:53 PM    PROCEDURE:  Procedure(s): Coronary/Graft Acute MI Revascularization (N/A) LEFT HEART CATH AND CORONARY ANGIOGRAPHY (N/A)  SURGEON:  Surgeons and Role:    * Marykay Lex, MD - Primary   PATIENT:  Brandon Foster  67 y.o. male with known CAD history of prior stents to the LAD presenting with inferior STEMI.  Unfortunate he did take tadalafil this morning.  PRE-OPERATIVE DIAGNOSIS: Inferior STEMI  POST-OPERATIVE DIAGNOSIS:   Severe multivessel disease with culprit lesion being proximal RCA occlusion 100% proximal RCA heavily thrombotic occlusion- DES PCI with Synergy XD 2.75 mm x 24 mm postdilated to 3.2 mm. => Distal embolization down the RPL branch.  Treated with DOTTER technique with the balloon restoring flow to about 90% of the vessel.  Will treat with 3 hours Aggrastat. Lesion reduced to 0% with TIMI-3 flow until the distal RPL With reperfusion, the patient became hypotensive and bradycardic required for 180 mcg of phenylephrine followed by initiation of Levophed infusion at 20 mcg/min.  We also gave 2050 mL NS bolus Initially gave cangrelor bolus with the thought of potentially doing PTCA only in staging for CABG, however when it became clear that there is a significant thrombus burden with downstream embolization => was converted to Aggrastat infusion to run for 3 hours post PCI Blood pressure stable after intervention. Proximal LAD 40% after small D1 and SP1 followed by D2 and then a stent that has 80% ISR in the proximal segment and 70% stenosis after the stent in the mid vessel. LCx gives off a small OM1 and then bifurcates into OM 2 and AV groove branch-there is 90% stenosis between OM1 and OM 2 Moderately elevated LVEDP of 21 to 22 mmHg.  Time Out: Verified patient identification, verified procedure, site/side was marked, verified correct patient position, special equipment/implants available, medications/allergies/relevent history reviewed, required  imaging and test results available. Performed.  Access:  RIGHT Radial Artery: 6 Fr sheath -- Seldinger technique using Micropuncture Kit -- Direct ultrasound guidance used.  Permanent image obtained and placed on chart. -- 10 mL radial cocktail IA; 9000 Units IV Heparin  Left Heart Catheterization: 5Fr Catheters advanced or exchanged over a J-wire under direct fluoroscopic guidance into the ascending aorta; TIG 4.0 catheter advanced first.  * LV Hemodynamics (LV Gram): JR4 catheter * Left Coronary Artery Cineangiography: JL 3.5 catheter  * Right Coronary Artery Cineangiography: JR4 catheter: 6 French AL 0.75 guide catheter  Review of initial angiography revealed: Occlusion of the proximal RCA  Preparations are made for RCA PCI with medical management and then staged PCI of the LAD and LCx -> Additional bolus of IV heparin administered to achieve an ACT greater than 250 seconds, also bolus of Kengreal followed by Aggrastat; 180 mg Brilinta.  RCA PCI performed: See CAD findings: -> 6 [place] AL 0.75 guide catheter, Prowater wire, 2.5 mm x 12 mm predilation balloon followed by DES PCI with Synergy XD 2.75 mm x 24 mm-postdilated with 3.0 mm balloon to 3.1 mm. -> With distalization, the wire was advanced into the very distal RPL 2 branch and the balloon was advanced down to the distal portion for DOTTER technique which advance the clot further distally leaving only maybe 10 to 15 mm of the distal vessel occluded.  Plan will be to run 3 hours Aggrastat.  Upon completion of Angiogaphy, the catheter was removed completely out of the body over a wire, without complication.  Radial sheath removed in the Cardiac Catheterization lab with TR Band  placed for hemostasis.  TR Band: 11 mL air; reverse Barbeau B  MEDICATIONS SQ Lidocaine 4 mL Radial Cocktail: 3 mg Verapmil in 10 mL NS Heparin: 12,000 units units IV Aggrastat bolus and infusion IV cangrelor bolus IV phenylephrine for 100 mcg IV  Levophed infusion started at 20 mg/min, reduced to 10 mg/min.   DICTATION: .Note written in EPIC  PATIENT DISPOSITION:  PACU - hemodynamically stable.  PLAN OF CARE: Admit to inpatient  ->  - Tirofiban for RPL embolization - Continue norepi for BP support  - Consider consult CTS for possible staged CABG but patient did not want CABG - Will likely end up staging LAD and CX per Complete trial - DAPT for 1 year or longer if low bleeding risk - ECHO  - Tele - Beta blocker once stabilized and off norepi - running @ 10 mcg/ min - Check Lipids, A1C - Statin - 80 mg Atorvastatin - Monitor in ICU - CIWA protocol    Delay start of Pharmacological VTE agent (>24hrs) due to surgical blood loss or risk of bleeding: not applicable    Marykay Lex, MD, MS Bryan Lemma, M.D., M.S. Interventional Cardiologist  [facility]  Pager # [phone] Phone # [phone] [address] [place], [place] [address]

## 2023-02-01 NOTE — H&P (Addendum)
Cardiology Admission History and Physical   Patient ID: Macie Schaer MRN: [id number]; DOB: 09/19/1955   Admission date: 02/01/2023  PCP:  Patient, No Pcp Per   [facility] Providers Cardiologist:  None        Chief Complaint:  STEMI  Patient Profile:   Armahn Trierweiler is a 67 y.o. male with a hx of MI s/p 2 stents to LAD 10 years ago, ?alcohol abuse who is being seen 02/01/2023 for the evaluation of inferior STEMI.  History of Present Illness:   Mr. Woodhams has a hx as above. He was home this afternoon and just finished coitus when he had sudden onset squeezing sensation going to his back. He went down stairs to get a drink and his wife hurd a thud. She came down and patient was on the floor confused. She called 911. He took 325mg  ASA. He was found to have inferior STEMI when EMS arrived and cath lab activated. He received morphine in route and was HDS on arrival. He was taken emergently for LHC.   On further hx, he states he Has 2 prior stents in his LAD for MI in [place] & most recently 10 years ago. He is currently taking ASA and tadalafil. He last took tadalafil this morning. He does drink a couple beerson weekends but denies heavy drinking. He denies prior withdrawals but chart documents some. He does not smoke.   He was found to have occlusive prox RCA and is s/p PCI. He also has obstructive instent LAD dz and obstructive Cx dz. He was loaded with ticagrelor in the lab. He did have distal embolization of thrombus during PCI to RPL branch which is being treated with tirofiban.    Past Medical History:  Diagnosis Date   Alcohol abuse    Depression    History of chickenpox     No past surgical history on file.   Medications Prior to Admission: Prior to Admission medications   Medication Sig Start Date End Date Taking? Authorizing Provider  aspirin 325 MG EC tablet Take 325 mg by mouth daily.    [provider]  divalproex (DEPAKOTE ER) 500 MG 24 hr tablet  Take 1 tablet (500 mg total) by mouth at bedtime. 08/18/17   Rodolph Bong, MD  famotidine (PEPCID) 20 MG tablet Take 1 tablet (20 mg total) by mouth at bedtime. 08/18/17   Rodolph Bong, MD  folic acid (FOLVITE) 1 MG tablet Take 1 tablet (1 mg total) by mouth daily. 08/19/17   Rodolph Bong, MD  Multiple Vitamin (MULTIVITAMIN) tablet Take 1 tablet by mouth daily.    [provider]  tadalafil (CIALIS) 10 MG tablet Use as directed Patient taking differently: Take 5 mg by mouth daily as needed for erectile dysfunction.  11/16/14   Nafziger, Kandee Keen, NP  thiamine 100 MG tablet Take 1 tablet (100 mg total) by mouth daily. 08/19/17   Rodolph Bong, MD     Allergies:   No Known Allergies  Social History:   Social History   Socioeconomic History   Marital status: Divorced    Spouse name: Not on file   Number of children: Not on file   Years of education: Not on file   Highest education level: Not on file  Occupational History   Not on file  Tobacco Use   Smoking status: Former   Smokeless tobacco: Never  Substance and Sexual Activity   Alcohol use: Yes    Alcohol/week:  4.0 standard drinks of alcohol    Types: 4 Cans of beer per week   Drug use: No   Sexual activity: Not on file  Other Topics Concern   Not on file  Social History Narrative   Not on file   Social Determinants of Health   Financial Resource Strain: Not on file  Food Insecurity: Not on file  Transportation Needs: Not on file  Physical Activity: Not on file  Stress: Not on file  Social Connections: Not on file  Intimate Partner Violence: Not on file    Family History:   The patient's family history includes Alcohol abuse in his father, paternal grandmother, and sister; Hypertension in his father. There is no history of Diabetes.    ROS:  Please see the history of present illness.  All other ROS reviewed and negative.     Physical Exam/Data:  There were no vitals filed for this visit. No  intake or output data in the 24 hours ending 02/01/23 1810    08/18/2017    5:23 AM 08/17/2017    2:10 PM 08/14/2017    6:46 AM  Last 3 Weights  Weight (lbs) 170 lb 12.8 oz 170 lb 11.2 oz 185 lb  Weight (kg) 77.474 kg 77.429 kg 83.915 kg     There is no height or weight on file to calculate BMI.  General:  Well nourished, well developed, mildly uncomfortable HEENT: normal Neck: no JVD Vascular: No carotid bruits; Distal pulses 2+ bilaterally   Cardiac:  normal S1, S2; RRR; no murmur  Lungs:  clear to auscultation bilaterally, no wheezing, rhonchi or rales  Abd: soft, nontender, no hepatomegaly  Ext: no edema Musculoskeletal:  No deformities, BUE and BLE strength normal and equal Skin: warm and dry  Neuro:  CNs 2-12 intact, no focal abnormalities noted Psych:  Normal affect    EKG:  The ECG that was done  was personally reviewed and demonstrates inferior STE with reciprocal changes  Relevant CV Studies: None  Laboratory Data:  High Sensitivity Troponin:  No results for input(s): "TROPONINIHS" in the last 720 hours.    ChemistryNo results for input(s): "NA", "K", "CL", "CO2", "GLUCOSE", "BUN", "CREATININE", "CALCIUM", "MG", "GFRNONAA", "GFRAA", "ANIONGAP" in the last 168 hours.  No results for input(s): "PROT", "ALBUMIN", "AST", "ALT", "ALKPHOS", "BILITOT" in the last 168 hours. Lipids No results for input(s): "CHOL", "TRIG", "HDL", "LABVLDL", "LDLCALC", "CHOLHDL" in the last 168 hours. HematologyNo results for input(s): "WBC", "RBC", "HGB", "HCT", "MCV", "MCH", "MCHC", "RDW", "PLT" in the last 168 hours. Thyroid No results for input(s): "TSH", "FREET4" in the last 168 hours. BNPNo results for input(s): "BNP", "PROBNP" in the last 168 hours.  DDimer No results for input(s): "DDIMER" in the last 168 hours.   Radiology/Studies:  No results found.   Assessment and Plan:   Inferior STEMI s/p PCI to pRCA with distal emobolization to RPL branch - treating with  tirofiban Obstructive in-stent stenosis of LAD  Obstructive Cx dz AMI shock requiring norepi - stable on 10 mcg/min Levophed after 480 mcg phenylephrine, 250 mL NS bolus & initial Levophed ~ 20 mcg/min.  Alcohol use disorder -  monitor for SSx of EtOH withdrawl  - S/p Emergent LHC and PCI to RCA - Tirofiban for RPL embolization - Continue norepi for BP support  - Consider consult CTS for possible staged CABG but patient did not want CABG - Will likely end up staging LAD and CX per Complete trial - DAPT for 1 year or  longer if low bleeding risk - ECHO  - Tele - Beta blocker once stabilized and off norepi - running @ 10 mcg/ min - Check Lipids, A1C - Statin - 80 mg Atorvastatin - Monitor in ICU - CIWA protocol     Risk Assessment/Risk Scores:   TIMI Risk Score for ST  Elevation MI:   The patient's TIMI risk score is 3, which indicates a 4.4% risk of all cause mortality at 30 days.{  Code Status: Full Code  Severity of Illness: The appropriate patient status for this patient is INPATIENT. Inpatient status is judged to be reasonable and necessary in order to provide the required intensity of service to ensure the patient's safety. The patient's presenting symptoms, physical exam findings, and initial radiographic and laboratory data in the context of their chronic comorbidities is felt to place them at high risk for further clinical deterioration. Furthermore, it is not anticipated that the patient will be medically stable for discharge from the hospital within 2 midnights of admission.   * I certify that at the point of admission it is my clinical judgment that the patient will require inpatient hospital care spanning beyond 2 midnights from the point of admission due to high intensity of service, high risk for further deterioration and high frequency of surveillance required.*   For questions or updates, please contact [facility] Please consult [email or web address] for contact  info under     Signed, Adline Mango, MD  02/01/2023 6:10 PM     ATTENDING ATTESTATION  I have seen, examined and evaluated the patient this evening along with Dr. Piedad Climes.  After reviewing all the available data and chart, we discussed the patients laboratory, study & physical findings as well as symptoms in detail.  I agree with his findings, examination as well as impression recommendations as per our discussion.    Attending adjustments noted in italics.  Complex coronary anatomy but with daughter present thrombotic occlusion of the RCA treated with DES stent, he indicated that he would not want to have CABG and therefore we will plan for staged PCI of the LAD and LCx at a later date.  With balloon angioplasty alone, there was still significant thrombus at the occlusion site.  After stent placement distalization down the PL branch will be treated with 3 hours of IV Aggrastat.  Trend troponin levels, check 2D echo Aggrastat for 3 hours and then restart IV heparin 8 hours after TR band removal for residual disease. High-dose statin Continue Levophed and wean off as BP tolerates, once stable, can initiate beta-blocker/ARB DAPT-long-term     Marykay Lex, MD, MS Bryan Lemma, M.D., M.S. Interventional Cardiologist  [facility]  Pager # [phone] Phone # [phone] [address] [place], [place] [address]

## 2023-02-01 NOTE — Progress Notes (Signed)
PHARMACY - ANTICOAGULATION CONSULT NOTE  Pharmacy Consult for heparin Indication: chest pain/ACS  No Known Allergies  Patient Measurements:   Heparin Dosing Weight: 82kg  Vital Signs:    Labs: Recent Labs    02/01/23 1808  HGB 17.8*  HCT 50.8  PLT 309  APTT 29  LABPROT 13.0  INR 1.0  CREATININE 1.35*  TROPONINIHS 38*    CrCl cannot be calculated (Unknown ideal weight.).   Medical History: Past Medical History:  Diagnosis Date   Alcohol abuse    Depression    History of chickenpox       Assessment: 76 yoM admitted as STEMI s/p DES to RCA. Pharmacy to resume heparin infusion 8h after sheath removal with plans for staged PCI this admission. Tirofiban started in cath lab and to continue for 3 hours in CVICU. Radial sheath removed ~1900.  Goal of Therapy:  Heparin level 0.3-0.7 units/ml Monitor platelets by anticoagulation protocol: Yes   Plan:  Heparin 1150 units/h no bolus at 0300 Check heparin level 6h after restart  Fredonia Highland, PharmD, BCPS, [facility] Clinical Pharmacist [phone] Please check AMION for all [facility] Pharmacy numbers 02/01/2023

## 2023-02-02 ENCOUNTER — Inpatient Hospital Stay (HOSPITAL_COMMUNITY): Payer: Medicare Other

## 2023-02-02 ENCOUNTER — Encounter (HOSPITAL_COMMUNITY): Payer: Self-pay | Admitting: Cardiology

## 2023-02-02 DIAGNOSIS — I2510 Atherosclerotic heart disease of native coronary artery without angina pectoris: Secondary | ICD-10-CM | POA: Diagnosis not present

## 2023-02-02 DIAGNOSIS — I2111 ST elevation (STEMI) myocardial infarction involving right coronary artery: Secondary | ICD-10-CM | POA: Diagnosis not present

## 2023-02-02 HISTORY — PX: TRANSTHORACIC ECHOCARDIOGRAM: SHX275

## 2023-02-02 LAB — CBC
HCT: 52.1 % — ABNORMAL HIGH (ref 39.0–52.0)
Hemoglobin: 17.9 g/dL — ABNORMAL HIGH (ref 13.0–17.0)
MCH: 29.6 pg (ref 26.0–34.0)
MCHC: 34.4 g/dL (ref 30.0–36.0)
MCV: 86.3 fL (ref 80.0–100.0)
Platelets: 241 10*3/uL (ref 150–400)
RBC: 6.04 MIL/uL — ABNORMAL HIGH (ref 4.22–5.81)
RDW: 14.6 % (ref 11.5–15.5)
WBC: 12.6 10*3/uL — ABNORMAL HIGH (ref 4.0–10.5)
nRBC: 0 % (ref 0.0–0.2)

## 2023-02-02 LAB — TROPONIN I (HIGH SENSITIVITY): Troponin I (High Sensitivity): >24000 ng/L (ref <18)

## 2023-02-02 LAB — BASIC METABOLIC PANEL
Anion gap: 13 (ref 5–15)
BUN: 17 mg/dL (ref 8–23)
CO2: 16 mmol/L — ABNORMAL LOW (ref 22–32)
Calcium: 8.6 mg/dL — ABNORMAL LOW (ref 8.9–10.3)
Chloride: 107 mmol/L (ref 98–111)
Creatinine, Ser: 1.2 mg/dL (ref 0.61–1.24)
GFR, Estimated: >60 mL/min (ref >60)
Glucose, Bld: 133 mg/dL — ABNORMAL HIGH (ref 70–99)
Potassium: 4.8 mmol/L (ref 3.5–5.1)
Sodium: 136 mmol/L (ref 135–145)

## 2023-02-02 LAB — LIPID PANEL
Cholesterol: 225 mg/dL — ABNORMAL HIGH (ref 0–200)
HDL: 34 mg/dL — ABNORMAL LOW (ref >40)
LDL Cholesterol: 169 mg/dL — ABNORMAL HIGH (ref 0–99)
Total CHOL/HDL Ratio: 6.6 {ratio}
Triglycerides: 112 mg/dL (ref <150)
VLDL: 22 mg/dL (ref 0–40)

## 2023-02-02 LAB — ECHOCARDIOGRAM COMPLETE
Height: 72 in
S' Lateral: 5 cm
Weight: 2938.29 [oz_av]

## 2023-02-02 LAB — HEMOGLOBIN A1C
Hgb A1c MFr Bld: 5.4 % (ref 4.8–5.6)
Mean Plasma Glucose: 108.28 mg/dL

## 2023-02-02 LAB — TSH: TSH: 3.565 u[IU]/mL (ref 0.350–4.500)

## 2023-02-02 LAB — HEPARIN LEVEL (UNFRACTIONATED): Heparin Unfractionated: 0.21 [IU]/mL — ABNORMAL LOW (ref 0.30–0.70)

## 2023-02-02 LAB — MRSA NEXT GEN BY PCR, NASAL: MRSA by PCR Next Gen: NOT DETECTED

## 2023-02-02 MED ORDER — HYDROXYZINE HCL 25 MG PO TABS
25.0000 mg | ORAL_TABLET | ORAL | Status: DC | PRN
  Administered 2023-02-02: 25 mg via ORAL
  Filled 2023-02-02: qty 1

## 2023-02-02 MED ORDER — SODIUM CHLORIDE 0.9% FLUSH
3.0000 mL | Freq: Two times a day (BID) | INTRAVENOUS | Status: DC
  Administered 2023-02-02 (×2): 3 mL via INTRAVENOUS

## 2023-02-02 NOTE — Plan of Care (Signed)
  Problem: Education: Goal: Knowledge of General Education information will improve Description: Including pain rating scale, medication(s)/side effects and non-pharmacologic comfort measures Outcome: Progressing   Problem: Health Behavior/Discharge Planning: Goal: Ability to manage health-related needs will improve Outcome: Progressing   Problem: Clinical Measurements: Goal: Ability to maintain clinical measurements within normal limits will improve Outcome: Progressing Goal: Will remain free from infection Outcome: Progressing Goal: Diagnostic test results will improve Outcome: Progressing Goal: Respiratory complications will improve Outcome: Progressing Goal: Cardiovascular complication will be avoided Outcome: Progressing   Problem: Activity: Goal: Risk for activity intolerance will decrease Outcome: Progressing   Problem: Nutrition: Goal: Adequate nutrition will be maintained Outcome: Not Progressing Note: Pt does not currently have appetite and has some residual nausea   Problem: Coping: Goal: Level of anxiety will decrease Outcome: Progressing Note: Pt anxious but responding to education and reassurance   Problem: Pain Management: Goal: General experience of comfort will improve Outcome: Progressing   Problem: Safety: Goal: Ability to remain free from injury will improve Outcome: Progressing   Problem: Activity: Goal: Ability to tolerate increased activity will improve Outcome: Progressing   Problem: Cardiac: Goal: Ability to achieve and maintain adequate cardiopulmonary perfusion will improve Outcome: Progressing Goal: Vascular access site(s) Level 0-1 will be maintained Outcome: Progressing   Problem: Health Behavior/Discharge Planning: Goal: Ability to safely manage health-related needs after discharge will improve Outcome: Progressing

## 2023-02-02 NOTE — Progress Notes (Signed)
PHARMACY - ANTICOAGULATION CONSULT NOTE  Pharmacy Consult for heparin Indication: chest pain/ACS  Allergies  Allergen Reactions   Banana     Causes cramping    Patient Measurements: Height: 6' (182.9 cm) Weight: 83.3 kg (183 lb 10.3 oz) IBW/kg (Calculated) : 77.6 Heparin Dosing Weight: 82kg  Vital Signs: Temp: 97.9 F (36.6 C) (11/17 1203) Temp Source: Oral (11/17 1203) BP: 119/75 (11/17 1402) Pulse Rate: 82 (11/17 1300)  Labs: Recent Labs    02/01/23 1808 02/01/23 1815 02/02/23 0305 02/02/23 0921  HGB 17.8* 18.0* 17.9*  --   HCT 50.8 53.0* 52.1*  --   PLT 309  --  241  --   APTT 29  --   --   --   LABPROT 13.0  --   --   --   INR 1.0  --   --   --   HEPARINUNFRC  --   --   --  0.21*  CREATININE 1.35* 1.20 1.20  --   TROPONINIHS 38*  --  >24,000*  --     Estimated Creatinine Clearance: 66.5 mL/min (by C-G formula based on SCr of 1.2 mg/dL).   Medical History: Past Medical History:  Diagnosis Date   Alcohol abuse    Depression    History of chickenpox       Assessment: Brandon Foster admitted as STEMI s/p DES to RCA. Pharmacy to resume heparin infusion 8h after sheath removal with plans for staged PCI this admission. Tirofiban started in cath lab and to continue for 3 hours in CVICU. Radial sheath removed ~1900. Heparin drip 1150 uts/hr with heparin level 0.2, cbc stable no bleeding   Goal of Therapy:  Heparin level 0.3-0.7 units/ml Monitor platelets by anticoagulation protocol: Yes   Plan:  Increase Heparin 1300 units/h  Daily CBC and heparin level  Monitor s/s bleeding    Leota Sauers Pharm.D. CPP, BCPS Clinical Pharmacist [phone] 02/02/2023 2:11 PM   Please check AMION for all [facility] Pharmacy numbers 02/02/2023

## 2023-02-02 NOTE — Progress Notes (Signed)
   Rounding Note    Patient Name: Brandon Foster Date of Encounter: 02/02/2023  [facility] Cardiologist: None   Subjective   NAEO. Norepi weaned off. Patient is declining statin.    Vital Signs    Vitals:   02/02/23 0400 02/02/23 0500 02/02/23 0600 02/02/23 0700  BP: 121/85 115/86 114/85   Pulse: 84 81 88 91  Resp: 10 12 13 11   Temp:      TempSrc:      SpO2: 96% 96% 94% 96%  Weight:      Height:        Intake/Output Summary (Last 24 hours) at 02/02/2023 0823 Last data filed at 02/02/2023 0700 Gross per 24 hour  Intake 1121.74 ml  Output --  Net 1121.74 ml      02/01/2023    8:00 PM 02/01/2023    7:45 PM 08/18/2017    5:23 AM  Last 3 Weights  Weight (lbs) 183 lb 10.3 oz 183 lb 10.3 oz 170 lb 12.8 oz  Weight (kg) 83.3 kg 83.3 kg 77.474 kg      Telemetry    Personally Reviewed  ECG    Personally Reviewed  Physical Exam   GEN: No acute distress.   Cardiac: RRR, no murmurs, rubs, or gallops.  Respiratory: Clear to auscultation bilaterally. Psych: Normal affect   Assessment & Plan    #CAD #STEMI Multivessel CAD on LHC yesterday. RCA (culprit) successfully treated c/b transient shock due to distal embolization. Awaiting staged PCI. - cont asa, ticagrelor - patient declines statin  Keep NPO after MN    Sheria Lang T. Lalla Brothers, MD, [facility], [facility] Cardiac Electrophysiology

## 2023-02-02 NOTE — Plan of Care (Signed)
  Problem: Education: Goal: Knowledge of General Education information will improve Description: Including pain rating scale, medication(s)/side effects and non-pharmacologic comfort measures 02/02/2023 0839 by Darryl Nestle, RN Outcome: Progressing 02/02/2023 0839 by Darryl Nestle, RN Outcome: Progressing   Problem: Health Behavior/Discharge Planning: Goal: Ability to manage health-related needs will improve 02/02/2023 0839 by Darryl Nestle, RN Outcome: Progressing 02/02/2023 0839 by Darryl Nestle, RN Outcome: Progressing   Problem: Clinical Measurements: Goal: Ability to maintain clinical measurements within normal limits will improve 02/02/2023 0839 by Darryl Nestle, RN Outcome: Progressing 02/02/2023 0839 by Darryl Nestle, RN Outcome: Progressing Goal: Will remain free from infection 02/02/2023 0839 by Darryl Nestle, RN Outcome: Progressing 02/02/2023 0839 by Darryl Nestle, RN Outcome: Progressing Goal: Diagnostic test results will improve 02/02/2023 0839 by Darryl Nestle, RN Outcome: Progressing 02/02/2023 0839 by Darryl Nestle, RN Outcome: Progressing Goal: Respiratory complications will improve 02/02/2023 0839 by Darryl Nestle, RN Outcome: Progressing 02/02/2023 0839 by Darryl Nestle, RN Outcome: Progressing Goal: Cardiovascular complication will be avoided 02/02/2023 0839 by Darryl Nestle, RN Outcome: Progressing 02/02/2023 0839 by Darryl Nestle, RN Outcome: Progressing   Problem: Activity: Goal: Risk for activity intolerance will decrease 02/02/2023 0839 by Darryl Nestle, RN Outcome: Progressing 02/02/2023 0839 by Darryl Nestle, RN Outcome: Progressing   Problem: Nutrition: Goal: Adequate nutrition will be maintained 02/02/2023 0839 by Darryl Nestle, RN Outcome: Progressing 02/02/2023 0839 by Darryl Nestle, RN Outcome: Progressing   Problem: Coping: Goal: Level  of anxiety will decrease 02/02/2023 0839 by Darryl Nestle, RN Outcome: Progressing 02/02/2023 0839 by Darryl Nestle, RN Outcome: Progressing   Problem: Elimination: Goal: Will not experience complications related to bowel motility 02/02/2023 0839 by Darryl Nestle, RN Outcome: Progressing 02/02/2023 0839 by Darryl Nestle, RN Outcome: Progressing Goal: Will not experience complications related to urinary retention 02/02/2023 0839 by Darryl Nestle, RN Outcome: Progressing 02/02/2023 0839 by Darryl Nestle, RN Outcome: Progressing   Problem: Pain Management: Goal: General experience of comfort will improve 02/02/2023 0839 by Darryl Nestle, RN Outcome: Progressing 02/02/2023 0839 by Darryl Nestle, RN Outcome: Progressing   Problem: Safety: Goal: Ability to remain free from injury will improve Outcome: Progressing   Problem: Education: Goal: Understanding of cardiac disease, CV risk reduction, and recovery process will improve Outcome: Progressing Goal: Understanding of medication regimen will improve Outcome: Progressing Goal: Individualized Educational Video(s) Outcome: Progressing   Problem: Activity: Goal: Ability to tolerate increased activity will improve Outcome: Progressing   Problem: Cardiac: Goal: Ability to achieve and maintain adequate cardiopulmonary perfusion will improve Outcome: Progressing Goal: Vascular access site(s) Level 0-1 will be maintained Outcome: Progressing   Problem: Health Behavior/Discharge Planning: Goal: Ability to safely manage health-related needs after discharge will improve Outcome: Progressing

## 2023-02-03 ENCOUNTER — Encounter (HOSPITAL_COMMUNITY): Payer: Self-pay | Admitting: Cardiology

## 2023-02-03 ENCOUNTER — Other Ambulatory Visit (HOSPITAL_COMMUNITY): Payer: Self-pay

## 2023-02-03 ENCOUNTER — Inpatient Hospital Stay (HOSPITAL_COMMUNITY)
Admission: EM | Disposition: A | Discharge status: Home / Self Care | Payer: Self-pay | Source: Other Acute Inpatient Hospital | Attending: Cardiology

## 2023-02-03 DIAGNOSIS — I25119 Atherosclerotic heart disease of native coronary artery with unspecified angina pectoris: Secondary | ICD-10-CM | POA: Diagnosis not present

## 2023-02-03 HISTORY — PX: CORONARY BALLOON ANGIOPLASTY: CATH118233

## 2023-02-03 HISTORY — PX: CORONARY STENT INTERVENTION: CATH118234

## 2023-02-03 LAB — CBC
HCT: 51.7 % (ref 39.0–52.0)
Hemoglobin: 17.8 g/dL — ABNORMAL HIGH (ref 13.0–17.0)
MCH: 29.5 pg (ref 26.0–34.0)
MCHC: 34.4 g/dL (ref 30.0–36.0)
MCV: 85.7 fL (ref 80.0–100.0)
Platelets: 201 10*3/uL (ref 150–400)
RBC: 6.03 MIL/uL — ABNORMAL HIGH (ref 4.22–5.81)
RDW: 14.7 % (ref 11.5–15.5)
WBC: 10.3 10*3/uL (ref 4.0–10.5)
nRBC: 0 % (ref 0.0–0.2)

## 2023-02-03 LAB — BASIC METABOLIC PANEL
Anion gap: 9 (ref 5–15)
BUN: 14 mg/dL (ref 8–23)
CO2: 19 mmol/L — ABNORMAL LOW (ref 22–32)
Calcium: 8.5 mg/dL — ABNORMAL LOW (ref 8.9–10.3)
Chloride: 109 mmol/L (ref 98–111)
Creatinine, Ser: 1.14 mg/dL (ref 0.61–1.24)
GFR, Estimated: >60 mL/min (ref >60)
Glucose, Bld: 85 mg/dL (ref 70–99)
Potassium: 4.5 mmol/L (ref 3.5–5.1)
Sodium: 137 mmol/L (ref 135–145)

## 2023-02-03 LAB — MAGNESIUM: Magnesium: 2.4 mg/dL (ref 1.7–2.4)

## 2023-02-03 LAB — POCT ACTIVATED CLOTTING TIME
Activated Clotting Time: 325 s
Activated Clotting Time: 210 s
Activated Clotting Time: 326 s

## 2023-02-03 LAB — HEPARIN LEVEL (UNFRACTIONATED): Heparin Unfractionated: 0.25 [IU]/mL — ABNORMAL LOW (ref 0.30–0.70)

## 2023-02-03 SURGERY — CORONARY STENT INTERVENTION
Anesthesia: LOCAL

## 2023-02-03 MED ORDER — SODIUM CHLORIDE 0.9 % IV SOLN: 250.0000 mL | INTRAVENOUS | Status: DC | PRN

## 2023-02-03 MED ORDER — IOHEXOL 350 MG/ML SOLN
INTRAVENOUS | Status: DC | PRN
  Administered 2023-02-03: 140 mL

## 2023-02-03 MED ORDER — VERAPAMIL HCL 2.5 MG/ML IV SOLN
INTRAVENOUS | Status: AC
  Filled 2023-02-03: qty 2

## 2023-02-03 MED ORDER — LIDOCAINE HCL (PF) 1 % IJ SOLN
INTRAMUSCULAR | Status: AC
  Filled 2023-02-03: qty 30

## 2023-02-03 MED ORDER — SODIUM CHLORIDE 0.9% FLUSH
3.0000 mL | Freq: Two times a day (BID) | INTRAVENOUS | Status: DC
  Administered 2023-02-03 – 2023-02-04 (×2): 3 mL via INTRAVENOUS

## 2023-02-03 MED ORDER — FENTANYL CITRATE (PF) 100 MCG/2ML IJ SOLN
INTRAMUSCULAR | Status: DC | PRN
  Administered 2023-02-03 (×4): 25 ug via INTRAVENOUS

## 2023-02-03 MED ORDER — SODIUM CHLORIDE 0.9 % IV SOLN: INTRAVENOUS | Status: AC

## 2023-02-03 MED ORDER — MIDAZOLAM HCL 2 MG/2ML IJ SOLN
INTRAMUSCULAR | Status: AC
Start: 2023-02-03 — End: ?
  Filled 2023-02-03: qty 2

## 2023-02-03 MED ORDER — HEPARIN SODIUM (PORCINE) 1000 UNIT/ML IJ SOLN
INTRAMUSCULAR | Status: AC
  Filled 2023-02-03: qty 10

## 2023-02-03 MED ORDER — LIDOCAINE HCL (PF) 1 % IJ SOLN
INTRAMUSCULAR | Status: DC | PRN
  Administered 2023-02-03: 2 mL

## 2023-02-03 MED ORDER — METOPROLOL SUCCINATE ER 25 MG PO TB24
25.0000 mg | ORAL_TABLET | Freq: Every day | ORAL | Status: DC
  Administered 2023-02-04: 25 mg via ORAL
  Filled 2023-02-03: qty 1

## 2023-02-03 MED ORDER — SODIUM CHLORIDE 0.9% FLUSH: 3.0000 mL | INTRAVENOUS | Status: DC | PRN

## 2023-02-03 MED ORDER — HEPARIN (PORCINE) IN NACL 1000-0.9 UT/500ML-% IV SOLN
INTRAVENOUS | Status: DC | PRN
  Administered 2023-02-03 (×2): 500 mL

## 2023-02-03 MED ORDER — FENTANYL CITRATE (PF) 100 MCG/2ML IJ SOLN
INTRAMUSCULAR | Status: AC
  Filled 2023-02-03: qty 2

## 2023-02-03 MED ORDER — VERAPAMIL HCL 2.5 MG/ML IV SOLN
INTRAVENOUS | Status: DC | PRN
  Administered 2023-02-03: 10 mL via INTRA_ARTERIAL

## 2023-02-03 MED ORDER — MORPHINE SULFATE (PF) 2 MG/ML IV SOLN
INTRAVENOUS | Status: AC
  Filled 2023-02-03: qty 1

## 2023-02-03 MED ORDER — ASPIRIN 81 MG PO CHEW: 81.0000 mg | CHEWABLE_TABLET | ORAL | Status: AC

## 2023-02-03 MED ORDER — HEPARIN SODIUM (PORCINE) 1000 UNIT/ML IJ SOLN
INTRAMUSCULAR | Status: DC | PRN
  Administered 2023-02-03: 4000 [IU] via INTRAVENOUS
  Administered 2023-02-03: 7500 [IU] via INTRAVENOUS

## 2023-02-03 MED ORDER — HYDRALAZINE HCL 20 MG/ML IJ SOLN: 10.0000 mg | INTRAMUSCULAR | Status: AC | PRN

## 2023-02-03 MED ORDER — MIDAZOLAM HCL 2 MG/2ML IJ SOLN
INTRAMUSCULAR | Status: DC | PRN
  Administered 2023-02-03 (×2): 1 mg via INTRAVENOUS

## 2023-02-03 MED ORDER — LABETALOL HCL 5 MG/ML IV SOLN: 10.0000 mg | INTRAVENOUS | Status: AC | PRN

## 2023-02-03 MED ORDER — SODIUM CHLORIDE 0.9 % IV SOLN: INTRAVENOUS | Status: DC

## 2023-02-03 SURGICAL SUPPLY — 21 items
BALLN EMERGE MR 2.25X8 (BALLOONS) ×1
BALLN EMERGE MR 2.5X12 (BALLOONS) ×1
BALLN EMERGE MR 3.0X15 (BALLOONS) ×1
BALLN SCOREFLEX 2.75X10 (BALLOONS) ×1
BALLN ~~LOC~~ EMERGE MR 3.0X12 (BALLOONS) ×1
BALLN ~~LOC~~ EMERGE MR 3.5X15 (BALLOONS) ×1
CATH LAUNCHER 6FR EBU3.5 (CATHETERS) ×1
DEVICE RAD COMP TR BAND LRG (VASCULAR PRODUCTS) ×1
ELECT DEFIB PAD ADLT CADENCE (PAD) ×1
GLIDESHEATH SLEND SS 6F .021 (SHEATH) ×1
INQWIRE 1.5J .035X260CM (WIRE) ×1
KIT ENCORE 26 ADVANTAGE (KITS) ×1
KIT HEART LEFT (KITS) ×1
PACK CARDIAC CATHETERIZATION (CUSTOM PROCEDURE TRAY) ×1
SHEATH PROBE COVER 6X72 (BAG) ×1
STENT ONYX FRONTIER 2.5X38 (Permanent Stent) ×1 IMPLANT
STENT ONYX FRONTIER 3.0X18 (Permanent Stent) ×1 IMPLANT
TRANSDUCER W/STOPCOCK (MISCELLANEOUS) ×1
TUBING CIL FLEX 10 FLL-RA (TUBING) ×1
WIRE ASAHI PROWATER 180CM (WIRE) ×1
WIRE RUNTHROUGH .014X180CM (WIRE) ×1

## 2023-02-03 NOTE — Interval H&P Note (Signed)
History and Physical Interval Note:  02/03/2023 1:58 PM  Brandon Foster  has presented today for surgery, with the diagnosis of cad / ischemic cardiomyopathy.  The various methods of treatment have been discussed with the patient and family. After consideration of risks, benefits and other options for treatment, the patient has consented to  Procedure(s): CORONARY STENT INTERVENTION (N/A)    as a surgical intervention.  The patient's history has been reviewed, patient examined, no change in status, stable for surgery.  I have reviewed the patient's chart and labs.  Questions were answered to the patient's satisfaction.    Cath Lab Visit (complete for each Cath Lab visit)  Clinical Evaluation Leading to the Procedure:   ACS: Yes.   - recent Inf STEMI  Non-ACS:    Anginal Classification: CCS II  Anti-ischemic medical therapy: Minimal Therapy (1 class of medications)  Non-Invasive Test Results: No non-invasive testing performed; Echo with Severly reduced LVEF  Prior CABG: No previous CABG     Bryan Lemma

## 2023-02-03 NOTE — Progress Notes (Addendum)
Transition of Care [facility]) - Inpatient Brief Assessment   Patient Details  Name: Rafat Poague MRN: [id number] Date of Birth: May 22, 1955  Transition of Care [facility]) CM/SW Contact:    Ronny Bacon, RN Phone Number: 02/03/2023, 3:55 PM   Clinical Narrative: Secure message received from heart failure case manager that patient will need lifevest. Message sent to [facility] with lifevest to see when will patient be fitted.   1625: Shelly Flatten order, and Cardiology notes emailed to [facility] with lifevest.   Transition of Care Asessment: Insurance and Status: (P) Insurance coverage has been reviewed Patient has primary care physician: (P) No (None listed) Home environment has been reviewed: (P) Home     Social Determinants of Health Reivew: (P) SDOH reviewed no interventions necessary Readmission risk has been reviewed: (P) Yes Transition of care needs: (P) transition of care needs identified, TOC will continue to follow

## 2023-02-03 NOTE — Progress Notes (Signed)
   Patient meets criteria for lifevest. Orders placed, rep notified.   SignedLaverda Page, NP-C 02/03/2023, 3:37 PM Pager: [phone]

## 2023-02-03 NOTE — H&P (View-Only) (Signed)
Rounding Note    Patient Name: Brandon Foster Date of Encounter: 02/03/2023  [facility] Cardiologist: None [place]  Subjective   No complaints today. No chest pain or dyspnea.   Inpatient Medications    Scheduled Meds:  aspirin  81 mg Oral Daily   atorvastatin  80 mg Oral Daily   Chlorhexidine Gluconate Cloth  6 each Topical Daily   famotidine  20 mg Oral QHS   folic acid  1 mg Oral Daily   multivitamin with minerals  1 tablet Oral Daily   sodium chloride flush  3 mL Intravenous Q12H   sodium chloride flush  3 mL Intravenous Q12H   thiamine  100 mg Oral Daily   ticagrelor  90 mg Oral BID   traZODone  50 mg Oral Once   Continuous Infusions:  heparin 1,400 Units/hr (02/03/23 0600)   norepinephrine (LEVOPHED) Adult infusion Stopped (02/01/23 2211)   PRN Meds: acetaminophen, hydrOXYzine, morphine injection, ondansetron (ZOFRAN) IV, mouth rinse, sodium chloride flush   Vital Signs    Vitals:   02/03/23 0630 02/03/23 0645 02/03/23 0700 02/03/23 0800  BP:   108/82 114/80  Pulse: 95 92 91 85  Resp: (!) 26 (!) 6 13 14   Temp:      TempSrc:      SpO2: 95% 95% 94% 97%  Weight:      Height:        Intake/Output Summary (Last 24 hours) at 02/03/2023 0829 Last data filed at 02/03/2023 0600 Gross per 24 hour  Intake 516.91 ml  Output --  Net 516.91 ml      02/01/2023    8:00 PM 02/01/2023    7:45 PM 08/18/2017    5:23 AM  Last 3 Weights  Weight (lbs) 183 lb 10.3 oz 183 lb 10.3 oz 170 lb 12.8 oz  Weight (kg) 83.3 kg 83.3 kg 77.474 kg      Telemetry    Sinus, short runs of NSVT - Personally Reviewed  ECG    No am EKG - Personally Reviewed  Physical Exam   GEN: No acute distress.   Neck: No JVD Cardiac: RRR, no murmurs, rubs, or gallops.  Respiratory: Clear to auscultation bilaterally. GI: Soft, nontender, non-distended  MS: No edema; No deformity. Neuro:  Nonfocal  Psych: Normal affect   Labs    High Sensitivity Troponin:   Recent Labs   Lab 02/01/23 1808 02/02/23 0305  TROPONINIHS 38* >24,000*     Chemistry Recent Labs  Lab 02/01/23 1808 02/01/23 1815 02/02/23 0305 02/03/23 0235  NA 137 139 136 137  K 3.6 3.5 4.8 4.5  CL 103 103 107 109  CO2 20*  --  16* 19*  GLUCOSE 121* 128* 133* 85  BUN 20 22 17 14   CREATININE 1.35* 1.20 1.20 1.14  CALCIUM 8.9  --  8.6* 8.5*  MG  --   --   --  2.4  PROT 6.5  --   --   --   ALBUMIN 3.4*  --   --   --   AST 26  --   --   --   ALT 33  --   --   --   ALKPHOS 47  --   --   --   BILITOT 0.7  --   --   --   GFRNONAA 58*  --  >60 >60  ANIONGAP 14  --  13 9    Lipids  Recent Labs  Lab 02/02/23 0305  CHOL 225*  TRIG 112  HDL 34*  LDLCALC 169*  CHOLHDL 6.6    Hematology Recent Labs  Lab 02/01/23 1808 02/01/23 1815 02/02/23 0305 02/03/23 0235  WBC 13.8*  --  12.6* 10.3  RBC 6.13*  --  6.04* 6.03*  HGB 17.8* 18.0* 17.9* 17.8*  HCT 50.8 53.0* 52.1* 51.7  MCV 82.9  --  86.3 85.7  MCH 29.0  --  29.6 29.5  MCHC 35.0  --  34.4 34.4  RDW 14.4  --  14.6 14.7  PLT 309  --  241 201   Thyroid  Recent Labs  Lab 02/02/23 0305  TSH 3.565    BNPNo results for input(s): "BNP", "PROBNP" in the last 168 hours.  DDimer No results for input(s): "DDIMER" in the last 168 hours.   Radiology    ECHOCARDIOGRAM COMPLETE  Result Date: 02/02/2023    ECHOCARDIOGRAM REPORT   Patient Name:   Brandon Foster Date of Exam: 02/02/2023 Medical Rec #:  [id number]   Height:       72.0 in Accession #:    [id number]  Weight:       183.6 lb Date of Birth:  [birth year]   BSA:          2.055 m Patient Age:    67 years    BP:           115/86 mmHg Patient Gender: M           HR:           102 bpm. Exam Location:  Inpatient Procedure: 2D Echo, Cardiac Doppler and Color Doppler Indications:    cad  History:        Patient has no prior history of Echocardiogram examinations. CAD                 and Acute MI.  Sonographer:    Melissa Morford RDCS (AE, PE) Referring Phys: DAVID W HARDING IMPRESSIONS  1.  Left ventricular ejection fraction, by estimation, is 25 to 30%. The left ventricle has severely decreased function. The left ventricle demonstrates regional wall motion abnormalities (see scoring diagram/findings for description). Left ventricular diastolic parameters are indeterminate. Most consistent with multi-vessel disease.  2. Right ventricular systolic function is normal. The right ventricular size is normal.  3. Left atrial size was moderately dilated.  4. The mitral valve is normal in structure. Mild mitral valve regurgitation. No evidence of mitral stenosis.  5. The aortic valve is tricuspid. There is mild calcification of the aortic valve. Aortic valve regurgitation is not visualized. Aortic valve sclerosis is present, with no evidence of aortic valve stenosis.  6. The inferior vena cava is normal in size with greater than 50% respiratory variability, suggesting right atrial pressure of 3 mmHg. Comparison(s): No prior Echocardiogram. FINDINGS  Left Ventricle: Left ventricular ejection fraction, by estimation, is 25 to 30%. The left ventricle has severely decreased function. The left ventricle demonstrates regional wall motion abnormalities. The left ventricular internal cavity size was normal  in size. There is no left ventricular hypertrophy. Left ventricular diastolic parameters are indeterminate.  LV Wall Scoring: The mid anteroseptal segment, basal inferolateral segment, basal anterolateral segment, mid inferoseptal segment, mid inferior segment, and basal inferoseptal segment are akinetic. The entire anterior wall, mid and distal lateral wall, basal anteroseptal segment, mid anterolateral segment, apical septal segment, basal inferior segment, and apical inferior segment are hypokinetic. Most consistent with multi-vessel disease. Right Ventricle: The right ventricular size is normal. No increase  in right ventricular wall thickness. Right ventricular systolic function is normal. Left Atrium: Left  atrial size was moderately dilated. Right Atrium: Right atrial size was normal in size. Pericardium: There is no evidence of pericardial effusion. Mitral Valve: The mitral valve is normal in structure. Mild mitral valve regurgitation. No evidence of mitral valve stenosis. Tricuspid Valve: The tricuspid valve is normal in structure. Tricuspid valve regurgitation is not demonstrated. No evidence of tricuspid stenosis. Aortic Valve: The aortic valve is tricuspid. There is mild calcification of the aortic valve. Aortic valve regurgitation is not visualized. Aortic valve sclerosis is present, with no evidence of aortic valve stenosis. Pulmonic Valve: The pulmonic valve was not well visualized. Pulmonic valve regurgitation is not visualized. No evidence of pulmonic stenosis. Aorta: The aortic root and ascending aorta are structurally normal, with no evidence of dilitation. Venous: The inferior vena cava is normal in size with greater than 50% respiratory variability, suggesting right atrial pressure of 3 mmHg. IAS/Shunts: No atrial level shunt detected by color flow Doppler.  LEFT VENTRICLE PLAX 2D LVIDd:         5.80 cm Diastology LVIDs:         5.00 cm LV e' medial:    5.11 cm/s LV PW:         1.00 cm LV E/e' medial:  0.9 LV IVS:        0.80 cm LV e' lateral:   8.16 cm/s                        LV E/e' lateral: 0.6  LEFT ATRIUM             Index        RIGHT ATRIUM           Index LA diam:        4.00 cm 1.95 cm/m   RA Area:     14.60 cm LA Vol (A2C):   72.6 ml 35.33 ml/m  RA Volume:   35.70 ml  17.37 ml/m LA Vol (A4C):   82.9 ml 40.35 ml/m LA Biplane Vol: 82.1 ml 39.96 ml/m  AORTIC VALVE LVOT Vmax:   77.00 cm/s LVOT Vmean:  61.800 cm/s LVOT VTI:    0.141 m  AORTA Ao Root diam: 3.40 cm Ao Asc diam:  3.10 cm MV E velocity: 4.79 cm/s                           SHUNTS                           Systemic VTI: 0.14 m Riley Lam MD Electronically signed by Riley Lam MD Signature Date/Time:  02/02/2023/1:32:28 PM    Final    CARDIAC CATHETERIZATION  Result Date: 02/02/2023   Culprit LESION prox RCA to Mid RCA lesion is 100% stenosed.  TIMI 0 flow   A drug-eluting stent was successfully placed using a SYNERGY XD 2.75X24.   Post intervention, there is a 0% residual stenosis.   --------------------------------------------------------   Prox LAD to Mid LAD lesion is 50% stenosed.   Lesion Segment #2: Mid LAD previously placed stent has a focal segment that is 80% stenosed, the remainder of the stent in the Mid LAD to Dist LAD is 45% stenosed. Dist LAD lesion is 70% stenosed, just beyond the stent.   Lesion #3: Prox Cx to Mid Cx lesion is 90%  stenosed.\ into 2nd Mrg lesion is 70% stenosed.   LV end diastolic pressure is moderately elevated.   There is no aortic valve stenosis.   Anticipated discharge date to be determined.   Discussion in the Cath Lab-patient was not interested in CABG there are fractures to proceed with PCI of the RCA and plan staged PCI of the LCx and LAD and a later date.  Will tentatively schedule for Monday, November 18.   Temporarily hold home antihypertensives to see what his blood pressure will do as it was somewhat low in the Cath Lab.  Consider adding back CCB and ARB.   Recommend dual antiplatelet therapy with Aspirin 81mg  daily and Ticagrelor 90mg  twice daily long-term (beyond 12 months) because of New stent in RCA during ACS with need for additional two-vessel PCI.[name]   Plan for additional year SAPT with either Brilinta/ticagrelor 60 mg twice daily or Plavix/clopidogrel 75 mg daily to complete at least another year and if not long-term POST CATH DIAGNOSES: Severe multivessel CAD Culprit Lesion: Proximal RCA 100%, heavily thrombotic DES PCI with Synergy XD 2.75 mm x 24 mm postdilated to 3.2 mm. => Distal embolization down the RPL branch.  Treated with DOTTER technique with the balloon restoring flow to about 90% of the vessel.  Lesion reduced to 0% with TIMI-3 flow until the  distal RPL Will treat with 3 hours Aggrastat. 90% LCx between 1st Mrg and 2ng Mrg 50% proximal LAD followed by 80% ISR in the mid LAD and additional 45% ISR and a 70% lesion just after the stent Moderately elevated LVEDP -  21 to 22 mmHg.[name] RECOMMENDATIONS  Admit to the ICU while on Aggrastat to complete 3 hours, and remains on Levophed at 10 mcg/KG/min-will wean off.  Anticipated discharge date to be determined   Discussion in the Cath Lab-patient was not interested in CABG there are fractures to proceed with PCI of the RCA and plan staged PCI of the LCx and LAD and a later date.  Will tentatively schedule for Monday, November 18.    Temporarily hold home antihypertensives to see what his blood pressure will do as it was somewhat low in the Cath Lab.  Consider adding back CCB and ARB.   Recommend dual antiplatelet therapy with Aspirin 81mg  daily and Ticagrelor 90mg  twice daily long-term (beyond 12 months) because of New stent in RCA during ACS with need for additional two-vessel PCI. Plan for additional year SAPT with either Brilinta/ticagrelor 60 mg twice daily or Plavix/clopidogrel 75 mg daily to complete at least another year and if not long-term Bryan Lemma, MD   Cardiac Studies     Patient Profile     67 y.o. male with history of CAD with prior stenting of the LAD who was admitted 02/01/23 with an inferior STEMI secondary to occluded proximal RCA which was treated with a drug eluting stent. Also with severe disease in the LAD and Circumflex.   Assessment & Plan    CAD/Inferior STEMI: Doing well today post PCI of the RCA on 02/01/23 in the setting of inferior ST elevation MI. No chest pain today. Plans for staged PCI of the LAD and Circumflex today. Continue DAPT with ASA and Brilinta. Continue statin. He remains on IV heparin. He was not on the cath schedule. I have added him for a late afternoon case.  Ischemic cardiomyopathy: LVEF=25-30%. Will add GDMT as tolerated.   For questions or  updates, please contact [facility] Please consult [email or web address] for contact info under  Signed, Verne Carrow, MD  02/03/2023, 8:29 AM

## 2023-02-03 NOTE — Progress Notes (Signed)
Heart Failure Nurse Navigator Progress Note  PCP: Patient, No Pcp Per PCP-Cardiologist: None Admission Diagnosis: STEMI Admitted from: Home Via EMS  Presentation:   Brandon Foster presented for evaluation of inferior STEMI, after experiencing a sudden onset of squeezing in his chest that radiated to his back and shoulder blades. Wife found patient on the floor confused, called 911. Found by EMS to have a inferior STEMI on the monitor and the cath lab was activated. Taken emergently for Left heart cath. Found to have 3 vessel disease, patient is not interested in a CABG at this time. Found to have occlusive prox RCA and is s/p PCI on 11/18.   Patient educated on the sign and symptoms of heart failure, daily weights, when to cal lhis doctor or go to the ED. Diet/ fluid restrictions, patient reported to needing to cut back on the salt, Education done on taking all medications as prescribed and attending all medical appointments, patient  verbalized his understanding, a HF TOC appointment was scheduled for 02/11/2023 @ 10 am.   ECHO/ LVEF: 25-30%  Clinical Course:  Past Medical History:  Diagnosis Date   Alcohol abuse    Depression    History of chickenpox      Social History   Socioeconomic History   Marital status: Divorced    Spouse name: Not on file   Number of children: Not on file   Years of education: Not on file   Highest education level: Not on file  Occupational History   Not on file  Tobacco Use   Smoking status: Former   Smokeless tobacco: Never  Substance and Sexual Activity   Alcohol use: Yes    Alcohol/week: 4.0 standard drinks of alcohol    Types: 4 Cans of beer per week   Drug use: No   Sexual activity: Not on file  Other Topics Concern   Not on file  Social History Narrative   Not on file   Social Determinants of Health   Financial Resource Strain: Not on file  Food Insecurity: No Food Insecurity (02/01/2023)   Hunger Vital Sign    Worried About Running  Out of Food in the Last Year: Never true    Ran Out of Food in the Last Year: Never true  Transportation Needs: No Transportation Needs (02/01/2023)   PRAPARE - Administrator, Civil Service (Medical): No    Lack of Transportation (Non-Medical): No  Physical Activity: Not on file  Stress: Not on file  Social Connections: Not on file   Education Assessment and Provision:  Detailed education and instructions provided on heart failure disease management including the following:  Signs and symptoms of Heart Failure When to call the physician Importance of daily weights Low sodium diet Fluid restriction Medication management Anticipated future follow-up appointments  Patient education given on each of the above topics.  Patient acknowledges understanding via teach back method and acceptance of all instructions.  Education Materials:  "Living Better With Heart Failure" Booklet, HF zone tool, & Daily Weight Tracker Tool.  Patient has scale at home: Yes Patient has pill box at home: Yes    High Risk Criteria for Readmission and/or Poor Patient Outcomes: Heart failure hospital admissions (last 6 months): 0  No Show rate: 0 Difficult social situation: No, Lives with his wife.  Demonstrates medication adherence: Yes Primary Language: English Literacy level: Reading, writing, and comprehension  Barriers of Care:   Diet/ fluid restrictions ( Salt)  Daily weights  Considerations/Referrals:  Referral made to Heart Failure Pharmacist Stewardship: Yes Referral made to Heart Failure CSW/NCM TOC: No Referral made to Heart & Vascular TOC clinic: Yes, 02/11/2023 @ 10 am.   Items for Follow-up on DC/TOC: Continued HF education Diet/ fluid restrictions ( Salt)  Daily weights   Rhae Hammock, BSN, RN Heart Failure Teacher, adult education Only

## 2023-02-03 NOTE — Progress Notes (Addendum)
PHARMACY - ANTICOAGULATION Pharmacy Consult for heparin Indication: chest pain/ACS  Allergies  Allergen Reactions   Banana     Causes cramping    Patient Measurements: Height: 6' (182.9 cm) Weight: 83.3 kg (183 lb 10.3 oz) IBW/kg (Calculated) : 77.6 Heparin Dosing Weight: 82kg  Vital Signs: Temp: 98.7 F (37.1 C) (11/18 0022) Temp Source: Oral (11/18 0022) BP: 115/77 (11/18 0300) Pulse Rate: 89 (11/18 0315)  Labs: Recent Labs    02/01/23 1808 02/01/23 1815 02/02/23 0305 02/02/23 0921 02/03/23 0235  HGB 17.8* 18.0* 17.9*  --  17.8*  HCT 50.8 53.0* 52.1*  --  51.7  PLT 309  --  241  --  201  APTT 29  --   --   --   --   LABPROT 13.0  --   --   --   --   INR 1.0  --   --   --   --   HEPARINUNFRC  --   --   --  0.21* 0.25*  CREATININE 1.35* 1.20 1.20  --  1.14  TROPONINIHS 38*  --  >24,000*  --   --     Estimated Creatinine Clearance: 70 mL/min (by C-G formula based on SCr of 1.14 mg/dL).   Assessment: 67 y.o. male with STEMI s/p PCI on 11/16 with DES to RCA. Pharmacy consulted to dose Heparin.   Heparin level slightly below goal range, 0.25. Plan for staged PCI of LAD and circumflex, will defer additional heparin level today. CBC ok.   Goal of Therapy:  Heparin level 0.3-0.7 units/ml Monitor platelets by anticoagulation protocol: Yes   Plan:  Continue Heparin 1400 units/hr until post-cath Monitor daily heparin level and CBC Continue to monitor H&H   F/U [facility] plans post cath this afternoon   Verdene Rio, PharmD PGY1 Pharmacy Resident

## 2023-02-03 NOTE — Progress Notes (Signed)
   Heart Failure Stewardship Pharmacist Progress Note   PCP: Patient, No Pcp Per PCP-Cardiologist: None    HPI:  67 yo M with PMH of CAD with prior stenting to LAD.  He presented to the ED on 11/16 with chest pain radiating to his back. Hs trop 38 initially and trended up to >24k. EKG with acute STEMI. Taken for emergent cath and found to have severe multivessel CAD. Patient not interested in CABG. Underwent PCI to RCA and plan for staged PCI to LCx and LAD. Scheduled for 11/18. With reperfusion, he became hypotensive and bradycardic requiring phenylephrine and levophed. ECHO 11/17 with EF 25-30%.   Met with patient at bedside prior to PCI later today. Denies having prescription insurance. Uses [facility] with [employer] cards. Discussed options with DAPT (Brilinta patient assistance vs 30 day trial and switch to plavix/prasugrel) and patient prefers not to undergo patient assistance. Had some concerns initially about starting GDMT but after discussion, patient understands the importance and is willing to start medications to help improve EF.   Current HF Medications: None  Prior to admission HF Medications: None  Pertinent Lab Values: Serum creatinine 1.14, BUN 14, Potassium 4.5, Sodium 137, Magnesium 2.4, A1c 5.4   Vital Signs: Weight: 183 lbs Blood pressure: 100-110/80s  Heart rate: 80-90s  I/O: incomplete  Medication Assistance / Insurance Benefits Check: Does the patient have prescription insurance?  No  Outpatient Pharmacy:  Prior to admission outpatient pharmacy: [facility] Is the patient willing to use [facility] TOC pharmacy at discharge? Yes Is the patient willing to transition their outpatient pharmacy to utilize a [facility] outpatient pharmacy?   No    Assessment: 1. Acute systolic CHF (LVEF 25-30%), due to ICM. NYHA class II symptoms. - Not volume overloaded on exam - Consider starting metoprolol XL 25 mg daily if no bradycardia in staged PCI today - Consider adding  ARB/MRA after cath if creatinine stable tomorrow   Plan: 1) Medication changes recommended at this time: - Add metoprolol XL 25 mg daily if hemodynamically stable in staged PCI today - Brilinta x 30 days then switch to plavix/prasugrel  2) Patient assistance: - No insurance - patient does not want to pursue patient assistance  - Uses GoodRx cards with [employer]  3)  Education  - Initial education completed - Full education to be completed prior to discharge  Sharen Hones, PharmD, BCPS Heart Failure Engineer, building services Phone [phone]

## 2023-02-03 NOTE — Progress Notes (Signed)
Rounding Note    Patient Name: Brandon Foster Date of Encounter: 02/03/2023  [facility] Cardiologist: None Harding  Subjective   No complaints today. No chest pain or dyspnea.   Inpatient Medications    Scheduled Meds:  aspirin  81 mg Oral Daily   atorvastatin  80 mg Oral Daily   Chlorhexidine Gluconate Cloth  6 each Topical Daily   famotidine  20 mg Oral QHS   folic acid  1 mg Oral Daily   multivitamin with minerals  1 tablet Oral Daily   sodium chloride flush  3 mL Intravenous Q12H   sodium chloride flush  3 mL Intravenous Q12H   thiamine  100 mg Oral Daily   ticagrelor  90 mg Oral BID   traZODone  50 mg Oral Once   Continuous Infusions:  heparin 1,400 Units/hr (02/03/23 0600)   norepinephrine (LEVOPHED) Adult infusion Stopped (02/01/23 2211)   PRN Meds: acetaminophen, hydrOXYzine, morphine injection, ondansetron (ZOFRAN) IV, mouth rinse, sodium chloride flush   Vital Signs    Vitals:   02/03/23 0630 02/03/23 0645 02/03/23 0700 02/03/23 0800  BP:   108/82 114/80  Pulse: 95 92 91 85  Resp: (!) 26 (!) 6 13 14   Temp:      TempSrc:      SpO2: 95% 95% 94% 97%  Weight:      Height:        Intake/Output Summary (Last 24 hours) at 02/03/2023 0829 Last data filed at 02/03/2023 0600 Gross per 24 hour  Intake 516.91 ml  Output --  Net 516.91 ml      02/01/2023    8:00 PM 02/01/2023    7:45 PM 08/18/2017    5:23 AM  Last 3 Weights  Weight (lbs) 183 lb 10.3 oz 183 lb 10.3 oz 170 lb 12.8 oz  Weight (kg) 83.3 kg 83.3 kg 77.474 kg      Telemetry    Sinus, short runs of NSVT - Personally Reviewed  ECG    No am EKG - Personally Reviewed  Physical Exam   GEN: No acute distress.   Neck: No JVD Cardiac: RRR, no murmurs, rubs, or gallops.  Respiratory: Clear to auscultation bilaterally. GI: Soft, nontender, non-distended  MS: No edema; No deformity. Neuro:  Nonfocal  Psych: Normal affect   Labs    High Sensitivity Troponin:   Recent Labs   Lab 02/01/23 1808 02/02/23 0305  TROPONINIHS 38* >24,000*     Chemistry Recent Labs  Lab 02/01/23 1808 02/01/23 1815 02/02/23 0305 02/03/23 0235  NA 137 139 136 137  K 3.6 3.5 4.8 4.5  CL 103 103 107 109  CO2 20*  --  16* 19*  GLUCOSE 121* 128* 133* 85  BUN 20 22 17 14   CREATININE 1.35* 1.20 1.20 1.14  CALCIUM 8.9  --  8.6* 8.5*  MG  --   --   --  2.4  PROT 6.5  --   --   --   ALBUMIN 3.4*  --   --   --   AST 26  --   --   --   ALT 33  --   --   --   ALKPHOS 47  --   --   --   BILITOT 0.7  --   --   --   GFRNONAA 58*  --  >60 >60  ANIONGAP 14  --  13 9    Lipids  Recent Labs  Lab 02/02/23 0305  CHOL 225*  TRIG 112  HDL 34*  LDLCALC 169*  CHOLHDL 6.6    Hematology Recent Labs  Lab 02/01/23 1808 02/01/23 1815 02/02/23 0305 02/03/23 0235  WBC 13.8*  --  12.6* 10.3  RBC 6.13*  --  6.04* 6.03*  HGB 17.8* 18.0* 17.9* 17.8*  HCT 50.8 53.0* 52.1* 51.7  MCV 82.9  --  86.3 85.7  MCH 29.0  --  29.6 29.5  MCHC 35.0  --  34.4 34.4  RDW 14.4  --  14.6 14.7  PLT 309  --  241 201   Thyroid  Recent Labs  Lab 02/02/23 0305  TSH 3.565    BNPNo results for input(s): "BNP", "PROBNP" in the last 168 hours.  DDimer No results for input(s): "DDIMER" in the last 168 hours.   Radiology    ECHOCARDIOGRAM COMPLETE  Result Date: 02/02/2023    ECHOCARDIOGRAM REPORT   Patient Name:   Brandon Foster Date of Exam: 02/02/2023 Medical Rec #:  [id number]   Height:       72.0 in Accession #:    [id number]  Weight:       183.6 lb Date of Birth:  [birth year]   BSA:          2.055 m Patient Age:    67 years    BP:           115/86 mmHg Patient Gender: M           HR:           102 bpm. Exam Location:  Inpatient Procedure: 2D Echo, Cardiac Doppler and Color Doppler Indications:    cad  History:        Patient has no prior history of Echocardiogram examinations. CAD                 and Acute MI.  Sonographer:    Melissa Morford RDCS (AE, PE) Referring Phys: DAVID W HARDING IMPRESSIONS  1.  Left ventricular ejection fraction, by estimation, is 25 to 30%. The left ventricle has severely decreased function. The left ventricle demonstrates regional wall motion abnormalities (see scoring diagram/findings for description). Left ventricular diastolic parameters are indeterminate. Most consistent with multi-vessel disease.  2. Right ventricular systolic function is normal. The right ventricular size is normal.  3. Left atrial size was moderately dilated.  4. The mitral valve is normal in structure. Mild mitral valve regurgitation. No evidence of mitral stenosis.  5. The aortic valve is tricuspid. There is mild calcification of the aortic valve. Aortic valve regurgitation is not visualized. Aortic valve sclerosis is present, with no evidence of aortic valve stenosis.  6. The inferior vena cava is normal in size with greater than 50% respiratory variability, suggesting right atrial pressure of 3 mmHg. Comparison(s): No prior Echocardiogram. FINDINGS  Left Ventricle: Left ventricular ejection fraction, by estimation, is 25 to 30%. The left ventricle has severely decreased function. The left ventricle demonstrates regional wall motion abnormalities. The left ventricular internal cavity size was normal  in size. There is no left ventricular hypertrophy. Left ventricular diastolic parameters are indeterminate.  LV Wall Scoring: The mid anteroseptal segment, basal inferolateral segment, basal anterolateral segment, mid inferoseptal segment, mid inferior segment, and basal inferoseptal segment are akinetic. The entire anterior wall, mid and distal lateral wall, basal anteroseptal segment, mid anterolateral segment, apical septal segment, basal inferior segment, and apical inferior segment are hypokinetic. Most consistent with multi-vessel disease. Right Ventricle: The right ventricular size is normal. No increase  in right ventricular wall thickness. Right ventricular systolic function is normal. Left Atrium: Left  atrial size was moderately dilated. Right Atrium: Right atrial size was normal in size. Pericardium: There is no evidence of pericardial effusion. Mitral Valve: The mitral valve is normal in structure. Mild mitral valve regurgitation. No evidence of mitral valve stenosis. Tricuspid Valve: The tricuspid valve is normal in structure. Tricuspid valve regurgitation is not demonstrated. No evidence of tricuspid stenosis. Aortic Valve: The aortic valve is tricuspid. There is mild calcification of the aortic valve. Aortic valve regurgitation is not visualized. Aortic valve sclerosis is present, with no evidence of aortic valve stenosis. Pulmonic Valve: The pulmonic valve was not well visualized. Pulmonic valve regurgitation is not visualized. No evidence of pulmonic stenosis. Aorta: The aortic root and ascending aorta are structurally normal, with no evidence of dilitation. Venous: The inferior vena cava is normal in size with greater than 50% respiratory variability, suggesting right atrial pressure of 3 mmHg. IAS/Shunts: No atrial level shunt detected by color flow Doppler.  LEFT VENTRICLE PLAX 2D LVIDd:         5.80 cm Diastology LVIDs:         5.00 cm LV e' medial:    5.11 cm/s LV PW:         1.00 cm LV E/e' medial:  0.9 LV IVS:        0.80 cm LV e' lateral:   8.16 cm/s                        LV E/e' lateral: 0.6  LEFT ATRIUM             Index        RIGHT ATRIUM           Index LA diam:        4.00 cm 1.95 cm/m   RA Area:     14.60 cm LA Vol (A2C):   72.6 ml 35.33 ml/m  RA Volume:   35.70 ml  17.37 ml/m LA Vol (A4C):   82.9 ml 40.35 ml/m LA Biplane Vol: 82.1 ml 39.96 ml/m  AORTIC VALVE LVOT Vmax:   77.00 cm/s LVOT Vmean:  61.800 cm/s LVOT VTI:    0.141 m  AORTA Ao Root diam: 3.40 cm Ao Asc diam:  3.10 cm MV E velocity: 4.79 cm/s                           SHUNTS                           Systemic VTI: 0.14 m Riley Lam MD Electronically signed by Riley Lam MD Signature Date/Time:  02/02/2023/1:32:28 PM    Final    CARDIAC CATHETERIZATION  Result Date: 02/02/2023   Culprit LESION prox RCA to Mid RCA lesion is 100% stenosed.  TIMI 0 flow   A drug-eluting stent was successfully placed using a SYNERGY XD 2.75X24.   Post intervention, there is a 0% residual stenosis.   --------------------------------------------------------   Prox LAD to Mid LAD lesion is 50% stenosed.   Lesion Segment #2: Mid LAD previously placed stent has a focal segment that is 80% stenosed, the remainder of the stent in the Mid LAD to Dist LAD is 45% stenosed. Dist LAD lesion is 70% stenosed, just beyond the stent.   Lesion #3: Prox Cx to Mid Cx lesion is 90%  stenosed.\ into 2nd Mrg lesion is 70% stenosed.   LV end diastolic pressure is moderately elevated.   There is no aortic valve stenosis.   Anticipated discharge date to be determined.   Discussion in the Cath Lab-patient was not interested in CABG there are fractures to proceed with PCI of the RCA and plan staged PCI of the LCx and LAD and a later date.  Will tentatively schedule for Monday, November 18.   Temporarily hold home antihypertensives to see what his blood pressure will do as it was somewhat low in the Cath Lab.  Consider adding back CCB and ARB.   Recommend dual antiplatelet therapy with Aspirin 81mg  daily and Ticagrelor 90mg  twice daily long-term (beyond 12 months) because of New stent in RCA during ACS with need for additional two-vessel PCI.[name]   Plan for additional year SAPT with either Brilinta/ticagrelor 60 mg twice daily or Plavix/clopidogrel 75 mg daily to complete at least another year and if not long-term POST CATH DIAGNOSES: Severe multivessel CAD Culprit Lesion: Proximal RCA 100%, heavily thrombotic DES PCI with Synergy XD 2.75 mm x 24 mm postdilated to 3.2 mm. => Distal embolization down the RPL branch.  Treated with DOTTER technique with the balloon restoring flow to about 90% of the vessel.  Lesion reduced to 0% with TIMI-3 flow until the  distal RPL Will treat with 3 hours Aggrastat. 90% LCx between 1st Mrg and 2ng Mrg 50% proximal LAD followed by 80% ISR in the mid LAD and additional 45% ISR and a 70% lesion just after the stent Moderately elevated LVEDP -  21 to 22 mmHg.[name] RECOMMENDATIONS  Admit to the ICU while on Aggrastat to complete 3 hours, and remains on Levophed at 10 mcg/KG/min-will wean off.  Anticipated discharge date to be determined   Discussion in the Cath Lab-patient was not interested in CABG there are fractures to proceed with PCI of the RCA and plan staged PCI of the LCx and LAD and a later date.  Will tentatively schedule for Monday, November 18.    Temporarily hold home antihypertensives to see what his blood pressure will do as it was somewhat low in the Cath Lab.  Consider adding back CCB and ARB.   Recommend dual antiplatelet therapy with Aspirin 81mg  daily and Ticagrelor 90mg  twice daily long-term (beyond 12 months) because of New stent in RCA during ACS with need for additional two-vessel PCI. Plan for additional year SAPT with either Brilinta/ticagrelor 60 mg twice daily or Plavix/clopidogrel 75 mg daily to complete at least another year and if not long-term Bryan Lemma, MD   Cardiac Studies     Patient Profile     67 y.o. male with history of CAD with prior stenting of the LAD who was admitted 02/01/23 with an inferior STEMI secondary to occluded proximal RCA which was treated with a drug eluting stent. Also with severe disease in the LAD and Circumflex.   Assessment & Plan    CAD/Inferior STEMI: Doing well today post PCI of the RCA on 02/01/23 in the setting of inferior ST elevation MI. No chest pain today. Plans for staged PCI of the LAD and Circumflex today. Continue DAPT with ASA and Brilinta. Continue statin. He remains on IV heparin. He was not on the cath schedule. I have added him for a late afternoon case.  Ischemic cardiomyopathy: LVEF=25-30%. Will add GDMT as tolerated.   For questions or  updates, please contact [facility] Please consult [email or web address] for contact info under  Signed, Verne Carrow, MD  02/03/2023, 8:29 AM

## 2023-02-04 ENCOUNTER — Encounter (HOSPITAL_COMMUNITY): Payer: Self-pay | Admitting: Cardiology

## 2023-02-04 ENCOUNTER — Other Ambulatory Visit (HOSPITAL_COMMUNITY): Payer: Self-pay

## 2023-02-04 DIAGNOSIS — I2111 ST elevation (STEMI) myocardial infarction involving right coronary artery: Secondary | ICD-10-CM | POA: Diagnosis not present

## 2023-02-04 DIAGNOSIS — E785 Hyperlipidemia, unspecified: Secondary | ICD-10-CM | POA: Insufficient documentation

## 2023-02-04 DIAGNOSIS — I255 Ischemic cardiomyopathy: Secondary | ICD-10-CM

## 2023-02-04 DIAGNOSIS — I5020 Unspecified systolic (congestive) heart failure: Secondary | ICD-10-CM | POA: Insufficient documentation

## 2023-02-04 LAB — BASIC METABOLIC PANEL
Anion gap: 7 (ref 5–15)
BUN: 14 mg/dL (ref 8–23)
CO2: 21 mmol/L — ABNORMAL LOW (ref 22–32)
Calcium: 7.5 mg/dL — ABNORMAL LOW (ref 8.9–10.3)
Chloride: 110 mmol/L (ref 98–111)
Creatinine, Ser: 1.06 mg/dL (ref 0.61–1.24)
GFR, Estimated: >60 mL/min (ref >60)
Glucose, Bld: 92 mg/dL (ref 70–99)
Potassium: 3.7 mmol/L (ref 3.5–5.1)
Sodium: 138 mmol/L (ref 135–145)

## 2023-02-04 LAB — CBC
HCT: 47.2 % (ref 39.0–52.0)
Hemoglobin: 16.1 g/dL (ref 13.0–17.0)
MCH: 29 pg (ref 26.0–34.0)
MCHC: 34.1 g/dL (ref 30.0–36.0)
MCV: 84.9 fL (ref 80.0–100.0)
Platelets: 226 10*3/uL (ref 150–400)
RBC: 5.56 MIL/uL (ref 4.22–5.81)
RDW: 15.1 % (ref 11.5–15.5)
WBC: 11.5 10*3/uL — ABNORMAL HIGH (ref 4.0–10.5)
nRBC: 0 % (ref 0.0–0.2)

## 2023-02-04 LAB — MAGNESIUM: Magnesium: 2.1 mg/dL (ref 1.7–2.4)

## 2023-02-04 LAB — LIPOPROTEIN A (LPA): Lipoprotein (a): 34.7 nmol/L — ABNORMAL HIGH (ref <75.0)

## 2023-02-04 MED ORDER — ASPIRIN 81 MG PO CHEW
81.0000 mg | CHEWABLE_TABLET | Freq: Every day | ORAL | 2 refills | Status: DC
  Filled 2023-02-04: qty 90, 90d supply, fill #0

## 2023-02-04 MED ORDER — TICAGRELOR 90 MG PO TABS
90.0000 mg | ORAL_TABLET | Freq: Two times a day (BID) | ORAL | 2 refills | Status: DC
  Filled 2023-02-04: qty 60, 30d supply, fill #0

## 2023-02-04 MED ORDER — NITROGLYCERIN 0.4 MG SL SUBL
0.4000 mg | SUBLINGUAL_TABLET | SUBLINGUAL | 2 refills | Status: DC | PRN
  Filled 2023-02-04: qty 25, 5d supply, fill #0

## 2023-02-04 MED ORDER — METOPROLOL SUCCINATE ER 25 MG PO TB24
25.0000 mg | ORAL_TABLET | Freq: Every day | ORAL | 1 refills | Status: DC
  Filled 2023-02-04: qty 90, 90d supply, fill #0

## 2023-02-04 MED FILL — Verapamil HCl IV Soln 2.5 MG/ML: INTRAVENOUS | Qty: 2 | Status: AC

## 2023-02-04 NOTE — Progress Notes (Signed)
CARDIAC REHAB PHASE I   PRE:  Rate/Rhythm: 90 NSR  BP:  Sitting: 111/89      SpO2: 97 RA  MODE:  Ambulation: 370 ft    POST:  Rate/Rhythm: 110 ST  BP:  Sitting: 122/92      SpO2: 97 RA  Pt ambulated supervision assistance in the hallway, pt walked well w/o symptoms.   Pt was educated on stent card, stent location, Antiplatelet and ASA use, wt restrictions, no baths/daily wash-ups, s/s of infection, ex guidelines, s/s to stop exercising, NTG use and calling 911, heart healthy diet, risk factors and CRPII. Pt received materials on exercise, diet, and CRPII. Will refer to [facility].   Pt voiced he is not interested in CRP2.   Faustino Congress  MS, ACSM-CEP 8:32 AM 02/04/2023    Service time is from 0800 to 0832.

## 2023-02-04 NOTE — Progress Notes (Signed)
   Heart Failure Stewardship Pharmacist Progress Note   PCP: Patient, No Pcp Per PCP-Cardiologist: None    HPI:  67 yo M with PMH of CAD with prior stenting to LAD.  He presented to the ED on 11/16 with chest pain radiating to his back. Hs trop 38 initially and trended up to >24k. EKG with acute STEMI. Taken for emergent cath and found to have severe multivessel CAD. Patient not interested in CABG. Underwent PCI to RCA and plan for staged PCI to LCx and LAD. With reperfusion, he became hypotensive and bradycardic requiring phenylephrine and levophed. ECHO 11/17 with EF 25-30%. S/p successful staged PCI to LCx and LAD.   Denies having prescription insurance. Uses [facility] with [employer] cards. Discussed options with DAPT (Brilinta patient assistance vs 30 day trial and switch to plavix/prasugrel) and patient prefers not to undergo patient assistance. Had some concerns initially about starting GDMT but after discussion, patient understands the importance and is willing to start medications to help improve EF.   Current HF Medications: Beta Blocker: metoprolol XL 25 mg daily  Prior to admission HF Medications: None  Pertinent Lab Values: Serum creatinine 1.06, BUN 14, Potassium 3.7, Sodium 138, Magnesium 2.1, A1c 5.4   Vital Signs: Weight: 182 lbs Blood pressure: 100-110/80s  Heart rate: 80-90s  I/O: incomplete  Medication Assistance / Insurance Benefits Check: Does the patient have prescription insurance?  No  Outpatient Pharmacy:  Prior to admission outpatient pharmacy: [facility] Is the patient willing to use [facility] TOC pharmacy at discharge? Yes Is the patient willing to transition their outpatient pharmacy to utilize a [facility] outpatient pharmacy?   No    Assessment: 1. Acute systolic CHF (LVEF 25-30%), due to ICM. NYHA class II symptoms. - Not volume overloaded on exam - Agree with starting metoprolol XL 25 mg daily  - Consider adding ARB/MRA at follow up. BP on the  softer side and adding BB.    Plan: 1) Medication changes recommended at this time: - Agree with changes - Brilinta x 30 days then switch to plavix/prasugrel  2) Patient assistance: - No insurance - patient does not want to pursue patient assistance  - Uses Good[employer] cards with [employer]  3)  Education  - Patient has been educated on current HF medications and potential additions to HF medication regimen - Patient verbalizes understanding that over the next few months, these medication doses may change and more medications may be added to optimize HF regimen - Patient has been educated on basic disease state pathophysiology and goals of therapy   Sharen Hones, PharmD, BCPS Heart Failure Engineer, building services Phone [phone]

## 2023-02-04 NOTE — Progress Notes (Deleted)
Pt stressed about upcoming procedure, she seems less anxious after education.   Pt received [facility] book and education on rehab and recovery. Will f/u throughout the week.   Brandon Foster 02/04/2023 12:07 PM

## 2023-02-04 NOTE — Discharge Summary (Addendum)
Discharge Summary    Patient ID: Brandon Foster MRN: [id number]; DOB: 1955-07-15  Admit date: 02/01/2023 Discharge date: 02/04/2023  PCP:  Patient, No Pcp Per   [facility] Providers Cardiologist:  Bryan Lemma, MD     Discharge Diagnoses    Principal Problem:   Acute ST elevation myocardial infarction (STEMI) involving right coronary artery [facility]) Active Problems:   Coronary artery disease involving native coronary artery of native heart with unstable angina pectoris (HCC)   Hyperlipidemia   HFrEF (heart failure with reduced ejection fraction) (HCC)   Diagnostic Studies/Procedures    Echo: 02/02/2023  IMPRESSIONS     1. Left ventricular ejection fraction, by estimation, is 25 to 30%. The  left ventricle has severely decreased function. The left ventricle  demonstrates regional wall motion abnormalities (see scoring  diagram/findings for description). Left ventricular  diastolic parameters are indeterminate. Most consistent with multi-vessel  disease.   2. Right ventricular systolic function is normal. The right ventricular  size is normal.   3. Left atrial size was moderately dilated.   4. The mitral valve is normal in structure. Mild mitral valve  regurgitation. No evidence of mitral stenosis.   5. The aortic valve is tricuspid. There is mild calcification of the  aortic valve. Aortic valve regurgitation is not visualized. Aortic valve  sclerosis is present, with no evidence of aortic valve stenosis.   6. The inferior vena cava is normal in size with greater than 50%  respiratory variability, suggesting right atrial pressure of 3 mmHg.   Comparison(s): No prior Echocardiogram.   FINDINGS   Left Ventricle: Left ventricular ejection fraction, by estimation, is 25  to 30%. The left ventricle has severely decreased function. The left  ventricle demonstrates regional wall motion abnormalities. The left  ventricular internal cavity size was normal   in size.  There is no left ventricular hypertrophy. Left ventricular  diastolic parameters are indeterminate.     LV Wall Scoring:  The mid anteroseptal segment, basal inferolateral segment, basal  anterolateral segment, mid inferoseptal segment, mid inferior segment, and  basal inferoseptal segment are akinetic. The entire anterior wall, mid and  distal lateral wall, basal anteroseptal segment, mid anterolateral  segment,  apical septal segment, basal inferior segment, and apical inferior segment  are hypokinetic. Most consistent with multi-vessel disease.   Right Ventricle: The right ventricular size is normal. No increase in  right ventricular wall thickness. Right ventricular systolic function is  normal.   Left Atrium: Left atrial size was moderately dilated.   Right Atrium: Right atrial size was normal in size.   Pericardium: There is no evidence of pericardial effusion.   Mitral Valve: The mitral valve is normal in structure. Mild mitral valve  regurgitation. No evidence of mitral valve stenosis.   Tricuspid Valve: The tricuspid valve is normal in structure. Tricuspid  valve regurgitation is not demonstrated. No evidence of tricuspid  stenosis.   Aortic Valve: The aortic valve is tricuspid. There is mild calcification  of the aortic valve. Aortic valve regurgitation is not visualized. Aortic  valve sclerosis is present, with no evidence of aortic valve stenosis.   Pulmonic Valve: The pulmonic valve was not well visualized. Pulmonic valve  regurgitation is not visualized. No evidence of pulmonic stenosis.   Aorta: The aortic root and ascending aorta are structurally normal, with  no evidence of dilitation.   Venous: The inferior vena cava is normal in size with greater than 50%  respiratory variability, suggesting right atrial pressure  of 3 mmHg.   IAS/Shunts: No atrial level shunt detected by color flow Doppler.   Cath: 02/01/2023    Culprit LESION prox RCA to Mid RCA  lesion is 100% stenosed.  TIMI 0 flow   A drug-eluting stent was successfully placed using a SYNERGY XD 2.75X24.   Post intervention, there is a 0% residual stenosis.   --------------------------------------------------------   Prox LAD to Mid LAD lesion is 50% stenosed.   Lesion Segment #2: Mid LAD previously placed stent has a focal segment that is 80% stenosed, the remainder of the stent in the Mid LAD to Dist LAD is 45% stenosed. Dist LAD lesion is 70% stenosed, just beyond the stent.   Lesion #3: Prox Cx to Mid Cx lesion is 90% stenosed.\ into 2nd Mrg lesion is 70% stenosed.   LV end diastolic pressure is moderately elevated.   There is no aortic valve stenosis.   Anticipated discharge date to be determined.   Discussion in the Cath Lab-patient was not interested in CABG there are fractures to proceed with PCI of the RCA and plan staged PCI of the LCx and LAD and a later date.  Will tentatively schedule for Monday, November 18.   Temporarily hold home antihypertensives to see what his blood pressure will do as it was somewhat low in the Cath Lab.  Consider adding back CCB and ARB.   Recommend dual antiplatelet therapy with Aspirin 81mg  daily and Ticagrelor 90mg  twice daily long-term (beyond 12 months) because of New stent in RCA during ACS with need for additional two-vessel PCI.[name]   Plan for additional year SAPT with either Brilinta/ticagrelor 60 mg twice daily or Plavix/clopidogrel 75 mg daily to complete at least another year and if not long-term   POST CATH DIAGNOSES: Severe multivessel CAD Culprit Lesion: Proximal RCA 100%, heavily thrombotic DES PCI with Synergy XD 2.75 mm x 24 mm postdilated to 3.2 mm. =>  Distal embolization down the RPL branch.  Treated with DOTTER technique with the balloon restoring flow to about 90% of the vessel.   Lesion reduced to 0% with TIMI-3 flow until the distal RPL Will treat with 3 hours Aggrastat. 90% LCx between 1st Mrg and 2ng Mrg 50% proximal LAD  followed by 80% ISR in the mid LAD and additional 45% ISR and a 70% lesion just after the stent Moderately elevated LVEDP -  21 to 22 mmHg.[name]   RECOMMENDATIONS  Admit to the ICU while on Aggrastat to complete 3 hours, and remains on Levophed at 10 mcg/KG/min-will wean off.  Anticipated discharge date to be determined    Discussion in the Cath Lab-patient was not interested in CABG there are fractures to proceed with PCI of the RCA and plan staged PCI of the LCx and LAD and a later date.   Will tentatively schedule for Monday, November 18.     Temporarily hold home antihypertensives to see what his blood pressure will do as it was somewhat low in the Cath Lab.  Consider adding back CCB and ARB.   Recommend dual antiplatelet therapy with Aspirin 81mg  daily and Ticagrelor 90mg  twice daily long-term (beyond 12 months) because of New stent in RCA during ACS with need for additional two-vessel PCI.  Plan for additional year SAPT with either Brilinta/ticagrelor 60 mg twice daily or Plavix/clopidogrel 75 mg daily to complete at least another year and if not long-term   Bryan Lemma, MD  Diagnostic Dominance: Right  Intervention    Cath: 02/03/2023    Prox  LAD to Mid LAD lesion is 50% stenosed.   LESION COMPLEX #2:  Mid LAD lesion is 80% stenosed. - In-stent Restenosis (overlapping stents); Mid LAD to Dist LAD lesion is 45% stenosed. -> ScoreFlex PTCA-DES PCI   Dist LAD lesion is 70% stenosed. - PTCA-DES PCI   A drug-eluting stent was successfully placed covering all 3 lesions, using a STENT ONYX FRONTIER 2.5X38.  Deployed to 2.6 mm, proximal 32 mm post-dilated to 3.1 mm. Post intervention, there is a 0% residual stenosis. TIMI 3 flow preserved.   -----------------------------------------------------------------------------   LESION COMPLEX #3: Prox Cx to Mid Cx lesion is 90% stenosed -> 2nd Mrg lesion is 70% stenosed.   A drug-eluting stent was successfully placed from Cx into 2nd Mrg, using a  STENT ONYX FRONTIER 3.0X18.- >  Deployed to 3.2 mm and proximal 15 mm postdilated to 3.6 mm; Post intervention, there is a 0% residual stenosis. in the Cx-2ndMrg & Mid Cx to Dist Cx lesion is 85% stenosed (jailed by Stent). TIMI 3 flow preserved.   1st Mrg lesion is 80% stenosed -> Post intervention, there is a 100% residual stenosis. (Jailed by stent - TIMI 0 flow)   Mid Cx to Dist Cx lesion is 85% stenosed =>Balloon angioplasty was performed THROUGH the stent into the distal vessel, using a BALLN EMERGE MR 2.25X8. Post intervention, there is a 30% residual stenosis. TIMI 3 Flow   SUCCESSFUL LAD PCI - covering 80%, 45% (ISR) & 70% distal to prior stent -- Overlapping Onyx Frontier DES 2.5 x  38 mm (tapered post-dilation 3.1 -> 2.6 mm) - all small Diagonal & Septal branches remained open.  SUCCESSFUL LCx-OM2 PCI - crossing OM1 & dist LCx using Onyx Frontier 3.0 x 18 mm -> deployed to 3.3 mm & post-dilated to 3.6 mm; OM1 jailed -> 100% occluded (too small for PTCA), distLCx post-stent 85%-> reduced to 30% with PTCA.     RECOMMENDATIONS   In the absence of any other complications or medical issues, we expect the patient to be ready for discharge from an interventional cardiology perspective on 02/04/2023.   Need to make sure that he is able to titrate GDMT for CAD and ICM. Needs TOC follow-up to titrate medications further and for CVRR lipid clinic to discuss PCSK9 inhibitor versus Inclisiran   Recommend dual antiplatelet therapy with Aspirin 81mg  daily and Ticagrelor 90mg  twice daily long-term (beyond 12 months) because of Extensive stents with 3 overlapping stents in the LAD.   Diagnostic Dominance: Right  Intervention   _____________   History of Present Illness     Burel Lindstrom is a 67 y.o. male with a hx of MI s/p 2 stents to LAD 10 years ago, ?alcohol abuse who was seen 02/01/2023 for the evaluation of inferior STEMI.   Mr. Bommer has a hx as above. He was home this afternoon and just  finished coitus when he had sudden onset squeezing sensation going to his back. He went down stairs to get a drink and his wife hurd a thud. She came down and patient was on the floor confused. She called 911. He took 325mg  ASA. He was found to have inferior STEMI when EMS arrived and cath lab activated. He received morphine in route and was HDS on arrival. He was taken emergently for LHC.    On further hx, he states he Has 2 prior stents in his LAD for MI in [place] & most recently 10 years ago. He is currently taking ASA and tadalafil.  He last took tadalafil this morning. He does drink a couple beerson weekends but denies heavy drinking. He denies prior withdrawals but chart documents some. He does not smoke.    He was found to have occlusive prox RCA and is s/p PCI. He also has obstructive instent LAD dz and obstructive Cx dz. He was loaded with ticagrelor in the lab. He did have distal embolization of thrombus during PCI to RPL branch which is being treated with tirofiban.   Hospital Course     Inferior STEMI -- Underwent cardiac catheterization 11/16 with culprit lesion being proximal RCA 100% thrombotically occluded treated with PCI/DES x 1.  Did have distal embolization down the RPL branch, treated with dotter technique with balloon restoring flow.  Residual 90% circumflex disease between OM1 and OM 2 that was treated in a staged fashion with DES x 1, along with 80%, 45% in-stent restenosis and 70% distal lesion to prior stent in overlapping fashion with DES x 1.  Recommendations for DAPT with aspirin/Brilinta for at least 1 year.  No recurrent chest pain.  Seen by cardiac rehab. -- Continue aspirin, Brilinta, metoprolol XL 25 mg daily  HFrEF ICM -- Echocardiogram with LVEF of 25 to 30%, normal RV, moderately dilated LA, mild MR with multiple wall motion abnormalities consistent with multivessel CAD. -- GDMT: limited in the setting of low blood pressure, did tolerate the addition of metoprolol  XL 25 mg daily.  He was also fitted with a LifeVest prior to discharge  Hyperlipidemia -- LDL 169, HDL 34, LP(a) 34.7 -- He refuses to take statins, will refer to lipid clinic for consideration of PCSK9  Patient seen by Dr. Clifton James and deemed stable for discharge home.  Follow-up arranged in the office.  Medication sent to [facility] pharmacy.  Educated by [employer].D. prior to discharge.  Did the patient have an acute coronary syndrome (MI, NSTEMI, STEMI, etc) this admission?:  Yes                               AHA/ACC ACS Clinical Performance & Quality Measures: Aspirin prescribed? - Yes ADP Receptor Inhibitor (Plavix/Clopidogrel, Brilinta/Ticagrelor or Effient/Prasugrel) prescribed (includes medically managed patients)? - Yes Beta Blocker prescribed? - Yes High Intensity Statin (Lipitor 40-80mg  or Crestor 20-40mg ) prescribed? - No - intolerant EF assessed during THIS hospitalization? - Yes For EF <40%, was ACEI/ARB prescribed? - No - Reason:  BP soft For EF <40%, Aldosterone Antagonist (Spironolactone or Eplerenone) prescribed? - No - Reason:  BP soft, consider outpatient Cardiac Rehab Phase II ordered (including medically managed patients)? - Yes   The patient will be scheduled for a TOC follow up appointment in 10-14 days.  A message has been sent to the [facility] and Scheduling Pool at the office where the patient should be seen for follow up.  _____________  Discharge Vitals Blood pressure (!) 133/100, pulse 100, temperature 98.9 F (37.2 C), temperature source Oral, resp. rate 19, height 6' (1.829 m), weight 82.6 kg, SpO2 95%.  Filed Weights   02/01/23 1945 02/01/23 2000 02/03/23 1026  Weight: 83.3 kg 83.3 kg 82.6 kg    Labs & Radiologic Studies    CBC Recent Labs    02/03/23 0235 02/04/23 0307  WBC 10.3 11.5*  HGB 17.8* 16.1  HCT 51.7 47.2  MCV 85.7 84.9  PLT 201 226   Basic Metabolic Panel Recent Labs    52/84/13 0235 02/04/23 0307  NA 137 138  K 4.5 3.7  CL 109 110   CO2 19* 21*  GLUCOSE 85 92  BUN 14 14  CREATININE 1.14 1.06  CALCIUM 8.5* 7.5*  MG 2.4 2.1   Liver Function Tests Recent Labs    02/01/23 1808  AST 26  ALT 33  ALKPHOS 47  BILITOT 0.7  PROT 6.5  ALBUMIN 3.4*   No results for input(s): "LIPASE", "AMYLASE" in the last 72 hours. High Sensitivity Troponin:   Recent Labs  Lab 02/01/23 1808 02/02/23 0305  TROPONINIHS 38* >24,000*    BNP Invalid input(s): "POCBNP" D-Dimer No results for input(s): "DDIMER" in the last 72 hours. Hemoglobin A1C Recent Labs    02/02/23 0305  HGBA1C 5.4   Fasting Lipid Panel Recent Labs    02/02/23 0305  CHOL 225*  HDL 34*  LDLCALC 169*  TRIG 112  CHOLHDL 6.6   Thyroid Function Tests Recent Labs    02/02/23 0305  TSH 3.565   _____________  CARDIAC CATHETERIZATION  Result Date: 02/03/2023   Prox LAD to Mid LAD lesion is 50% stenosed.   LESION COMPLEX #2:  Mid LAD lesion is 80% stenosed. - In-stent Restenosis (overlapping stents); Mid LAD to Dist LAD lesion is 45% stenosed. -> ScoreFlex PTCA-DES PCI   Dist LAD lesion is 70% stenosed. - PTCA-DES PCI   A drug-eluting stent was successfully placed covering all 3 lesions, using a STENT ONYX FRONTIER 2.5X38.  Deployed to 2.6 mm, proximal 32 mm post-dilated to 3.1 mm. Post intervention, there is a 0% residual stenosis. TIMI 3 flow preserved.   --------------------------------------------------------------------------- --   LESION COMPLEX #3: Prox Cx to Mid Cx lesion is 90% stenosed -> 2nd Mrg lesion is 70% stenosed.   A drug-eluting stent was successfully placed from Cx into 2nd Mrg, using a STENT ONYX FRONTIER 3.0X18.- >  Deployed to 3.2 mm and proximal 15 mm postdilated to 3.6 mm; Post intervention, there is a 0% residual stenosis. in the Cx-2ndMrg & Mid Cx to Dist Cx lesion is 85% stenosed (jailed by Stent). TIMI 3 flow preserved.   1st Mrg lesion is 80% stenosed -> Post intervention, there is a 100% residual stenosis. (Jailed by stent - TIMI  0 flow)   Mid Cx to Dist Cx lesion is 85% stenosed =>Balloon angioplasty was performed THROUGH the stent into the distal vessel, using a BALLN EMERGE MR 2.25X8. Post intervention, there is a 30% residual stenosis. TIMI 3 Flow SUCCESSFUL LAD PCI - covering 80%, 45% (ISR) & 70% distal to prior stent -- Overlapping Onyx Frontier DES 2.5 x  38 mm (tapered post-dilation 3.1 -> 2.6 mm) - all small Diagonal & Septal branches remained open. SUCCESSFUL LCx-OM2 PCI - crossing OM1 & dist LCx using Onyx Frontier 3.0 x 18 mm -> deployed to 3.3 mm & post-dilated to 3.6 mm; OM1 jailed -> 100% occluded (too small for PTCA), distLCx post-stent 85%-> reduced to 30% with PTCA. RECOMMENDATIONS   In the absence of any other complications or medical issues, we expect the patient to be ready for discharge from an interventional cardiology perspective on 02/04/2023.   Need to make sure that he is able to titrate GDMT for CAD and ICM. Needs TOC follow-up to titrate medications further and for CVRR lipid clinic to discuss PCSK9 inhibitor versus Inclisiran   Recommend dual antiplatelet therapy with Aspirin 81mg  daily and Ticagrelor 90mg  twice daily long-term (beyond 12 months) because of Extensive stents with 3 overlapping stents in the LAD.   ECHOCARDIOGRAM COMPLETE  Result  Date: 02/02/2023    ECHOCARDIOGRAM REPORT   Patient Name:   Brandon Foster Date of Exam: 02/02/2023 Medical Rec #:  [id number]   Height:       72.0 in Accession #:    [id number]  Weight:       183.6 lb Date of Birth:  1955/06/19   BSA:          2.055 m Patient Age:    67 years    BP:           115/86 mmHg Patient Gender: M           HR:           102 bpm. Exam Location:  Inpatient Procedure: 2D Echo, Cardiac Doppler and Color Doppler Indications:    cad  History:        Patient has no prior history of Echocardiogram examinations. CAD                 and Acute MI.  Sonographer:    Melissa Morford RDCS (AE, PE) Referring Phys: DAVID W HARDING IMPRESSIONS  1. Left  ventricular ejection fraction, by estimation, is 25 to 30%. The left ventricle has severely decreased function. The left ventricle demonstrates regional wall motion abnormalities (see scoring diagram/findings for description). Left ventricular diastolic parameters are indeterminate. Most consistent with multi-vessel disease.  2. Right ventricular systolic function is normal. The right ventricular size is normal.  3. Left atrial size was moderately dilated.  4. The mitral valve is normal in structure. Mild mitral valve regurgitation. No evidence of mitral stenosis.  5. The aortic valve is tricuspid. There is mild calcification of the aortic valve. Aortic valve regurgitation is not visualized. Aortic valve sclerosis is present, with no evidence of aortic valve stenosis.  6. The inferior vena cava is normal in size with greater than 50% respiratory variability, suggesting right atrial pressure of 3 mmHg. Comparison(s): No prior Echocardiogram. FINDINGS  Left Ventricle: Left ventricular ejection fraction, by estimation, is 25 to 30%. The left ventricle has severely decreased function. The left ventricle demonstrates regional wall motion abnormalities. The left ventricular internal cavity size was normal  in size. There is no left ventricular hypertrophy. Left ventricular diastolic parameters are indeterminate.  LV Wall Scoring: The mid anteroseptal segment, basal inferolateral segment, basal anterolateral segment, mid inferoseptal segment, mid inferior segment, and basal inferoseptal segment are akinetic. The entire anterior wall, mid and distal lateral wall, basal anteroseptal segment, mid anterolateral segment, apical septal segment, basal inferior segment, and apical inferior segment are hypokinetic. Most consistent with multi-vessel disease. Right Ventricle: The right ventricular size is normal. No increase in right ventricular wall thickness. Right ventricular systolic function is normal. Left Atrium: Left atrial  size was moderately dilated. Right Atrium: Right atrial size was normal in size. Pericardium: There is no evidence of pericardial effusion. Mitral Valve: The mitral valve is normal in structure. Mild mitral valve regurgitation. No evidence of mitral valve stenosis. Tricuspid Valve: The tricuspid valve is normal in structure. Tricuspid valve regurgitation is not demonstrated. No evidence of tricuspid stenosis. Aortic Valve: The aortic valve is tricuspid. There is mild calcification of the aortic valve. Aortic valve regurgitation is not visualized. Aortic valve sclerosis is present, with no evidence of aortic valve stenosis. Pulmonic Valve: The pulmonic valve was not well visualized. Pulmonic valve regurgitation is not visualized. No evidence of pulmonic stenosis. Aorta: The aortic root and ascending aorta are structurally normal, with no evidence of dilitation. Venous: The inferior  vena cava is normal in size with greater than 50% respiratory variability, suggesting right atrial pressure of 3 mmHg. IAS/Shunts: No atrial level shunt detected by color flow Doppler.  LEFT VENTRICLE PLAX 2D LVIDd:         5.80 cm Diastology LVIDs:         5.00 cm LV e' medial:    5.11 cm/s LV PW:         1.00 cm LV E/e' medial:  0.9 LV IVS:        0.80 cm LV e' lateral:   8.16 cm/s                        LV E/e' lateral: 0.6  LEFT ATRIUM             Index        RIGHT ATRIUM           Index LA diam:        4.00 cm 1.95 cm/m   RA Area:     14.60 cm LA Vol (A2C):   72.6 ml 35.33 ml/m  RA Volume:   35.70 ml  17.37 ml/m LA Vol (A4C):   82.9 ml 40.35 ml/m LA Biplane Vol: 82.1 ml 39.96 ml/m  AORTIC VALVE LVOT Vmax:   77.00 cm/s LVOT Vmean:  61.800 cm/s LVOT VTI:    0.141 m  AORTA Ao Root diam: 3.40 cm Ao Asc diam:  3.10 cm MV E velocity: 4.79 cm/s                           SHUNTS                           Systemic VTI: 0.14 m Brandon Lam MD Electronically signed by Brandon Lam MD Signature Date/Time: 02/02/2023/1:32:28  PM    Final    CARDIAC CATHETERIZATION  Result Date: 02/02/2023   Culprit LESION prox RCA to Mid RCA lesion is 100% stenosed.  TIMI 0 flow   A drug-eluting stent was successfully placed using a SYNERGY XD 2.75X24.   Post intervention, there is a 0% residual stenosis.   --------------------------------------------------------   Prox LAD to Mid LAD lesion is 50% stenosed.   Lesion Segment #2: Mid LAD previously placed stent has a focal segment that is 80% stenosed, the remainder of the stent in the Mid LAD to Dist LAD is 45% stenosed. Dist LAD lesion is 70% stenosed, just beyond the stent.   Lesion #3: Prox Cx to Mid Cx lesion is 90% stenosed.\ into 2nd Mrg lesion is 70% stenosed.   LV end diastolic pressure is moderately elevated.   There is no aortic valve stenosis.   Anticipated discharge date to be determined.   Discussion in the Cath Lab-patient was not interested in CABG there are fractures to proceed with PCI of the RCA and plan staged PCI of the LCx and LAD and a later date.  Will tentatively schedule for Monday, November 18.   Temporarily hold home antihypertensives to see what his blood pressure will do as it was somewhat low in the Cath Lab.  Consider adding back CCB and ARB.   Recommend dual antiplatelet therapy with Aspirin 81mg  daily and Ticagrelor 90mg  twice daily long-term (beyond 12 months) because of New stent in RCA during ACS with need for additional two-vessel PCI.[name]   Plan for additional year SAPT with  either Brilinta/ticagrelor 60 mg twice daily or Plavix/clopidogrel 75 mg daily to complete at least another year and if not long-term POST CATH DIAGNOSES: Severe multivessel CAD Culprit Lesion: Proximal RCA 100%, heavily thrombotic DES PCI with Synergy XD 2.75 mm x 24 mm postdilated to 3.2 mm. => Distal embolization down the RPL branch.  Treated with DOTTER technique with the balloon restoring flow to about 90% of the vessel.  Lesion reduced to 0% with TIMI-3 flow until the distal RPL Will  treat with 3 hours Aggrastat. 90% LCx between 1st Mrg and 2ng Mrg 50% proximal LAD followed by 80% ISR in the mid LAD and additional 45% ISR and a 70% lesion just after the stent Moderately elevated LVEDP -  21 to 22 mmHg.[name] RECOMMENDATIONS  Admit to the ICU while on Aggrastat to complete 3 hours, and remains on Levophed at 10 mcg/KG/min-will wean off.  Anticipated discharge date to be determined   Discussion in the Cath Lab-patient was not interested in CABG there are fractures to proceed with PCI of the RCA and plan staged PCI of the LCx and LAD and a later date.  Will tentatively schedule for Monday, November 18.    Temporarily hold home antihypertensives to see what his blood pressure will do as it was somewhat low in the Cath Lab.  Consider adding back CCB and ARB.   Recommend dual antiplatelet therapy with Aspirin 81mg  daily and Ticagrelor 90mg  twice daily long-term (beyond 12 months) because of New stent in RCA during ACS with need for additional two-vessel PCI. Plan for additional year SAPT with either Brilinta/ticagrelor 60 mg twice daily or Plavix/clopidogrel 75 mg daily to complete at least another year and if not long-term Bryan Lemma, MD  Disposition   Pt is being discharged home today in good condition.  Follow-up Plans & Appointments     Follow-up Information     [facility]. Go in 7 day(s).   Specialty: Cardiology Why: Hospital follow up 02/11/2023 @ 10 am PLEASE bring a current medication list to appointment FREE valet parking, Entrance C, off [employer] information: [address] [place] [address] [phone]               Discharge Instructions     (HEART FAILURE PATIENTS) Call MD:  Anytime you have any of the following symptoms: 1) 3 pound weight gain in 24 hours or 5 pounds in 1 week 2) shortness of breath, with or without a dry hacking cough 3) swelling in the hands, feet or stomach 4)  if you have to sleep on extra pillows at night in order to breathe.   Complete by: As directed    AMB Referral to Advanced Lipid Disorders Clinic   Complete by: As directed    Internal Lipid Clinic Referral Scheduling  Internal lipid clinic referrals are providers within [facility], who wish to refer established patients for routine management (help in starting PCSK9 inhibitor therapy) or advanced therapies.  Internal MD referral criteria:              1. All patients with LDL>190 mg/dL  2. All patients with Triglycerides >500 mg/dL  3. Patients with suspected or confirmed heterozygous familial hyperlipidemia (HeFH) or homozygous familial hyperlipidemia (HoFH)  4. Patients with family history of suspicious for genetic dyslipidemia desiring genetic testing  5. Patients refractory to standard guideline based therapy  6. Patients with statin intolerance (failed 2 statins, one of which must be  a high potency statin)  7. Patients who the provider desires to be seen by MD   Internal PharmD referral criteria:   1. Follow-up patients for medication management  2. Follow-up for compliance monitoring  3. Patients for drug education  4. Patients with statin intolerance  5. PCSK9 inhibitor education and prior authorization approvals  6. Patients with triglycerides <500 mg/dL  External Lipid Clinic Referral  External lipid clinic referrals are for providers outside of [facility], considered new clinic patients - automatically routed to MD schedule   Amb Referral to Cardiac Rehabilitation   Complete by: As directed    Diagnosis:  Coronary Stents STEMI     After initial evaluation and assessments completed: Virtual Based Care may be provided alone or in conjunction with Phase 2 Cardiac Rehab based on patient barriers.: Yes   Intensive Cardiac Rehabilitation (ICR) [facility] location only OR Traditional Cardiac Rehabilitation (TCR) *If criteria for ICR are not met will enroll in TCR [facility] only): Yes    Call MD for:  difficulty breathing, headache or visual disturbances   Complete by: As directed    Call MD for:  persistant dizziness or light-headedness   Complete by: As directed    Call MD for:  redness, tenderness, or signs of infection (pain, swelling, redness, odor or green/yellow discharge around incision site)   Complete by: As directed    Diet - low sodium heart healthy   Complete by: As directed    Discharge instructions   Complete by: As directed    Radial Site Care Refer to this sheet in the next few weeks. These instructions provide you with information on caring for yourself after your procedure. Your caregiver may also give you more specific instructions. Your treatment has been planned according to current medical practices, but problems sometimes occur. Call your caregiver if you have any problems or questions after your procedure. HOME CARE INSTRUCTIONS You may shower the day after the procedure. Remove the bandage (dressing) and gently wash the site with plain soap and water. Gently pat the site dry.  Do not apply powder or lotion to the site.  Do not submerge the affected site in water for 3 to 5 days.  Inspect the site at least twice daily.  Do not flex or bend the affected arm for 24 hours.  No lifting over 5 pounds (2.3 kg) for 5 days after your procedure.  Do not drive home if you are discharged the same day of the procedure. Have someone else drive you.  You may drive 24 hours after the procedure unless otherwise instructed by your caregiver.  What to expect: Any bruising will usually fade within 1 to 2 weeks.  Blood that collects in the tissue (hematoma) may be painful to the touch. It should usually decrease in size and tenderness within 1 to 2 weeks.  SEEK IMMEDIATE MEDICAL CARE IF: You have unusual pain at the radial site.  You have redness, warmth, swelling, or pain at the radial site.  You have drainage (other than a small amount of blood on the dressing).   You have chills.  You have a fever or persistent symptoms for more than 72 hours.  You have a fever and your symptoms suddenly get worse.  Your arm becomes pale, cool, tingly, or numb.  You have heavy bleeding from the site. Hold pressure on the site.   PLEASE DO NOT MISS ANY DOSES OF YOUR BRILINTA!!!!! Also keep a log of you blood pressures and  bring back to your follow up appt. Please call the office with any questions.   Patients taking blood thinners should generally stay away from medicines like ibuprofen, Advil, Motrin, naproxen, and Aleve due to risk of stomach bleeding. You may take Tylenol as directed or talk to your primary doctor about alternatives.    PLEASE ENSURE THAT YOU DO NOT RUN OUT OF YOUR BRILINTA. This medication is very important to remain on for at least one year. IF you have issues obtaining this medication due to cost please CALL the office 3-5 business days prior to running out in order to prevent missing doses of this medication.   Increase activity slowly   Complete by: As directed         Discharge Medications   Allergies as of 02/04/2023       Reactions   Banana    Causes cramping        Medication List     STOP taking these medications    aspirin EC 325 MG tablet Replaced by: Aspirin Low Dose 81 MG chewable tablet       TAKE these medications    Aspirin Low Dose 81 MG chewable tablet Generic drug: aspirin Chew 1 tablet (81 mg total) by mouth daily. Start taking on: February 05, 2023 Replaces: aspirin EC 325 MG tablet   b complex vitamins capsule Take 1 capsule by mouth daily.   Brilinta 90 MG Tabs tablet Generic drug: ticagrelor Take 1 tablet (90 mg total) by mouth 2 (two) times daily.   metoprolol succinate 25 MG 24 hr tablet Commonly known as: TOPROL-XL Take 1 tablet (25 mg total) by mouth daily. Start taking on: February 05, 2023   nitroGLYCERIN 0.4 MG SL tablet Commonly known as: Nitrostat Place 1 tablet (0.4 mg  total) under the tongue every 5 (five) minutes as needed.   tadalafil 5 MG tablet Commonly known as: CIALIS Take 5 mg by mouth daily.   testosterone cypionate 100 MG/ML injection Commonly known as: DEPOTESTOTERONE CYPIONATE Inject 50 mg into the muscle See admin instructions. Inject 50mg  into the muscle twice a week.   VITAMIN C PO Take 1 tablet by mouth daily.   VITAMIN D PO Take 1 tablet by mouth daily.   VITAMIN E PO Take 1 capsule by mouth daily.               Durable Medical Equipment  (From admission, onward)           Start     Ordered   02/03/23 1538  For home use only DME Vest life vest  Once       Comments: Length of need -3 months   02/03/23 1537             Outstanding Labs/Studies   Referral to lipid clinic   Duration of Discharge Encounter   Greater than 30 minutes including physician time.  Signed, Laverda Page, NP 02/04/2023, 1:00 PM  I have personally seen and examined this patient. I agree with the assessment and plan as outlined above.  See my full note this am.   Verne Carrow, MD, [facility] 02/04/2023 1:27 PM

## 2023-02-04 NOTE — Progress Notes (Signed)
Rounding Note    Patient Name: Brandon Foster Date of Encounter: 02/04/2023  [facility] Cardiologist: None Harding  Subjective   No chest pain  Inpatient Medications    Scheduled Meds:  aspirin  81 mg Oral Daily   atorvastatin  80 mg Oral Daily   Chlorhexidine Gluconate Cloth  6 each Topical Daily   famotidine  20 mg Oral QHS   folic acid  1 mg Oral Daily   metoprolol succinate  25 mg Oral Daily   multivitamin with minerals  1 tablet Oral Daily   sodium chloride flush  3 mL Intravenous Q12H   thiamine  100 mg Oral Daily   ticagrelor  90 mg Oral BID   traZODone  50 mg Oral Once   Continuous Infusions:  norepinephrine (LEVOPHED) Adult infusion Stopped (02/01/23 2211)   PRN Meds: acetaminophen, hydrOXYzine, morphine injection, ondansetron (ZOFRAN) IV, mouth rinse, sodium chloride flush   Vital Signs    Vitals:   02/04/23 0354 02/04/23 0400 02/04/23 0500 02/04/23 0600  BP:  (!) 87/61 97/76 103/76  Pulse:  87 79 88  Resp:  12 16 16   Temp: 99 F (37.2 C)     TempSrc: Oral     SpO2:  94% 93% 95%  Weight:      Height:        Intake/Output Summary (Last 24 hours) at 02/04/2023 1610 Last data filed at 02/04/2023 0500 Gross per 24 hour  Intake 959.6 ml  Output --  Net 959.6 ml      02/03/2023   10:26 AM 02/01/2023    8:00 PM 02/01/2023    7:45 PM  Last 3 Weights  Weight (lbs) 182 lb 1.6 oz 183 lb 10.3 oz 183 lb 10.3 oz  Weight (kg) 82.6 kg 83.3 kg 83.3 kg      Telemetry    Sinus - Personally Reviewed  ECG    No am EKG - Personally Reviewed  Physical Exam   General: Well developed, well nourished, NAD  HEENT: OP clear, mucus membranes moist  SKIN: warm, dry. No rashes. Neuro: No focal deficits  Musculoskeletal: Muscle strength 5/5 all ext  Psychiatric: Mood and affect normal  Neck: No JVD Lungs:Clear bilaterally, no wheezes, rhonci, crackles Cardiovascular: Regular rate and rhythm. No murmurs, gallops or rubs. Abdomen:Soft. Bowel  sounds present. Non-tender.  Extremities: No lower extremity edema.   Labs    High Sensitivity Troponin:   Recent Labs  Lab 02/01/23 1808 02/02/23 0305  TROPONINIHS 38* >24,000*     Chemistry Recent Labs  Lab 02/01/23 1808 02/01/23 1815 02/02/23 0305 02/03/23 0235 02/04/23 0307  NA 137   < > 136 137 138  K 3.6   < > 4.8 4.5 3.7  CL 103   < > 107 109 110  CO2 20*  --  16* 19* 21*  GLUCOSE 121*   < > 133* 85 92  BUN 20   < > 17 14 14   CREATININE 1.35*   < > 1.20 1.14 1.06  CALCIUM 8.9  --  8.6* 8.5* 7.5*  MG  --   --   --  2.4 2.1  PROT 6.5  --   --   --   --   ALBUMIN 3.4*  --   --   --   --   AST 26  --   --   --   --   ALT 33  --   --   --   --  ALKPHOS 47  --   --   --   --   BILITOT 0.7  --   --   --   --   GFRNONAA 58*  --  >60 >60 >60  ANIONGAP 14  --  13 9 7    < > = values in this interval not displayed.    Lipids  Recent Labs  Lab 02/02/23 0305  CHOL 225*  TRIG 112  HDL 34*  LDLCALC 169*  CHOLHDL 6.6    Hematology Recent Labs  Lab 02/02/23 0305 02/03/23 0235 02/04/23 0307  WBC 12.6* 10.3 11.5*  RBC 6.04* 6.03* 5.56  HGB 17.9* 17.8* 16.1  HCT 52.1* 51.7 47.2  MCV 86.3 85.7 84.9  MCH 29.6 29.5 29.0  MCHC 34.4 34.4 34.1  RDW 14.6 14.7 15.1  PLT 241 201 226   Thyroid  Recent Labs  Lab 02/02/23 0305  TSH 3.565    BNPNo results for input(s): "BNP", "PROBNP" in the last 168 hours.  DDimer No results for input(s): "DDIMER" in the last 168 hours.   Radiology    CARDIAC CATHETERIZATION  Result Date: 02/03/2023   Prox LAD to Mid LAD lesion is 50% stenosed.   LESION COMPLEX #2:  Mid LAD lesion is 80% stenosed. - In-stent Restenosis (overlapping stents); Mid LAD to Dist LAD lesion is 45% stenosed. -> ScoreFlex PTCA-DES PCI   Dist LAD lesion is 70% stenosed. - PTCA-DES PCI   A drug-eluting stent was successfully placed covering all 3 lesions, using a STENT ONYX FRONTIER 2.5X38.  Deployed to 2.6 mm, proximal 32 mm post-dilated to 3.1 mm. Post  intervention, there is a 0% residual stenosis. TIMI 3 flow preserved.   --------------------------------------------------------------------------- --   LESION COMPLEX #3: Prox Cx to Mid Cx lesion is 90% stenosed -> 2nd Mrg lesion is 70% stenosed.   A drug-eluting stent was successfully placed from Cx into 2nd Mrg, using a STENT ONYX FRONTIER 3.0X18.- >  Deployed to 3.2 mm and proximal 15 mm postdilated to 3.6 mm; Post intervention, there is a 0% residual stenosis. in the Cx-2ndMrg & Mid Cx to Dist Cx lesion is 85% stenosed (jailed by Stent). TIMI 3 flow preserved.   1st Mrg lesion is 80% stenosed -> Post intervention, there is a 100% residual stenosis. (Jailed by stent - TIMI 0 flow)   Mid Cx to Dist Cx lesion is 85% stenosed =>Balloon angioplasty was performed THROUGH the stent into the distal vessel, using a BALLN EMERGE MR 2.25X8. Post intervention, there is a 30% residual stenosis. TIMI 3 Flow SUCCESSFUL LAD PCI - covering 80%, 45% (ISR) & 70% distal to prior stent -- Overlapping Onyx Frontier DES 2.5 x  38 mm (tapered post-dilation 3.1 -> 2.6 mm) - all small Diagonal & Septal branches remained open. SUCCESSFUL LCx-OM2 PCI - crossing OM1 & dist LCx using Onyx Frontier 3.0 x 18 mm -> deployed to 3.3 mm & post-dilated to 3.6 mm; OM1 jailed -> 100% occluded (too small for PTCA), distLCx post-stent 85%-> reduced to 30% with PTCA. RECOMMENDATIONS   In the absence of any other complications or medical issues, we expect the patient to be ready for discharge from an interventional cardiology perspective on 02/04/2023.   Need to make sure that he is able to titrate GDMT for CAD and ICM. Needs TOC follow-up to titrate medications further and for CVRR lipid clinic to discuss PCSK9 inhibitor versus Inclisiran   Recommend dual antiplatelet therapy with Aspirin 81mg  daily and Ticagrelor 90mg  twice daily long-term (  beyond 12 months) because of Extensive stents with 3 overlapping stents in the LAD.   ECHOCARDIOGRAM  COMPLETE  Result Date: 02/02/2023    ECHOCARDIOGRAM REPORT   Patient Name:   Brandon Foster Date of Exam: 02/02/2023 Medical Rec #:  [id number]   Height:       72.0 in Accession #:    [id number]  Weight:       183.6 lb Date of Birth:  [birth year]   BSA:          2.055 m Patient Age:    67 years    BP:           115/86 mmHg Patient Gender: M           HR:           102 bpm. Exam Location:  Inpatient Procedure: 2D Echo, Cardiac Doppler and Color Doppler Indications:    cad  History:        Patient has no prior history of Echocardiogram examinations. CAD                 and Acute MI.  Sonographer:    Melissa Morford RDCS (AE, PE) Referring Phys: DAVID W HARDING IMPRESSIONS  1. Left ventricular ejection fraction, by estimation, is 25 to 30%. The left ventricle has severely decreased function. The left ventricle demonstrates regional wall motion abnormalities (see scoring diagram/findings for description). Left ventricular diastolic parameters are indeterminate. Most consistent with multi-vessel disease.  2. Right ventricular systolic function is normal. The right ventricular size is normal.  3. Left atrial size was moderately dilated.  4. The mitral valve is normal in structure. Mild mitral valve regurgitation. No evidence of mitral stenosis.  5. The aortic valve is tricuspid. There is mild calcification of the aortic valve. Aortic valve regurgitation is not visualized. Aortic valve sclerosis is present, with no evidence of aortic valve stenosis.  6. The inferior vena cava is normal in size with greater than 50% respiratory variability, suggesting right atrial pressure of 3 mmHg. Comparison(s): No prior Echocardiogram. FINDINGS  Left Ventricle: Left ventricular ejection fraction, by estimation, is 25 to 30%. The left ventricle has severely decreased function. The left ventricle demonstrates regional wall motion abnormalities. The left ventricular internal cavity size was normal  in size. There is no left ventricular  hypertrophy. Left ventricular diastolic parameters are indeterminate.  LV Wall Scoring: The mid anteroseptal segment, basal inferolateral segment, basal anterolateral segment, mid inferoseptal segment, mid inferior segment, and basal inferoseptal segment are akinetic. The entire anterior wall, mid and distal lateral wall, basal anteroseptal segment, mid anterolateral segment, apical septal segment, basal inferior segment, and apical inferior segment are hypokinetic. Most consistent with multi-vessel disease. Right Ventricle: The right ventricular size is normal. No increase in right ventricular wall thickness. Right ventricular systolic function is normal. Left Atrium: Left atrial size was moderately dilated. Right Atrium: Right atrial size was normal in size. Pericardium: There is no evidence of pericardial effusion. Mitral Valve: The mitral valve is normal in structure. Mild mitral valve regurgitation. No evidence of mitral valve stenosis. Tricuspid Valve: The tricuspid valve is normal in structure. Tricuspid valve regurgitation is not demonstrated. No evidence of tricuspid stenosis. Aortic Valve: The aortic valve is tricuspid. There is mild calcification of the aortic valve. Aortic valve regurgitation is not visualized. Aortic valve sclerosis is present, with no evidence of aortic valve stenosis. Pulmonic Valve: The pulmonic valve was not well visualized. Pulmonic valve regurgitation is not visualized. No evidence of  pulmonic stenosis. Aorta: The aortic root and ascending aorta are structurally normal, with no evidence of dilitation. Venous: The inferior vena cava is normal in size with greater than 50% respiratory variability, suggesting right atrial pressure of 3 mmHg. IAS/Shunts: No atrial level shunt detected by color flow Doppler.  LEFT VENTRICLE PLAX 2D LVIDd:         5.80 cm Diastology LVIDs:         5.00 cm LV e' medial:    5.11 cm/s LV PW:         1.00 cm LV E/e' medial:  0.9 LV IVS:        0.80 cm LV e'  lateral:   8.16 cm/s                        LV E/e' lateral: 0.6  LEFT ATRIUM             Index        RIGHT ATRIUM           Index LA diam:        4.00 cm 1.95 cm/m   RA Area:     14.60 cm LA Vol (A2C):   72.6 ml 35.33 ml/m  RA Volume:   35.70 ml  17.37 ml/m LA Vol (A4C):   82.9 ml 40.35 ml/m LA Biplane Vol: 82.1 ml 39.96 ml/m  AORTIC VALVE LVOT Vmax:   77.00 cm/s LVOT Vmean:  61.800 cm/s LVOT VTI:    0.141 m  AORTA Ao Root diam: 3.40 cm Ao Asc diam:  3.10 cm MV E velocity: 4.79 cm/s                           SHUNTS                           Systemic VTI: 0.14 m Riley Lam MD Electronically signed by Riley Lam MD Signature Date/Time: 02/02/2023/1:32:28 PM    Final     Cardiac Studies     Patient Profile     67 y.o. male with history of CAD with prior stenting of the LAD who was admitted 02/01/23 with an inferior STEMI secondary to occluded proximal RCA which was treated with a drug eluting stent. Also with severe disease in the LAD and Circumflex.   Assessment & Plan    CAD/Inferior STEMI: He is stable today post PCI. He had PCI of the RCA on 02/01/23 in the setting of inferior ST elevation MI and then staged PCI of the Circumflex and LAD on 02/03/23. No chest pain today. Will continue DAPT with ASA and Brilinta. He has no insurance so will get 30 days of Brilinta here then can change to Plavix as an outpatient if needed. He refuses to take statins. Will need to discuss Repatha or Praluent as an outpatient. Continue low dow beta blocker.   Ischemic cardiomyopathy: LVEF=25-30%. Toprol added yesterday. Due to soft BP will not be able to add an ARB at this time. Lifevest at time of discharge.   He may be able to be discharged home today if we can arrange his Lifevest. Follow up with Dr. Herbie Baltimore.   For questions or updates, please contact [facility] Please consult [email or web address] for contact info under        Signed, Verne Carrow, MD  02/04/2023, 8:22  AM

## 2023-02-11 ENCOUNTER — Encounter (HOSPITAL_COMMUNITY): Payer: Self-pay

## 2023-02-11 ENCOUNTER — Ambulatory Visit (HOSPITAL_COMMUNITY)
Admit: 2023-02-11 | Discharge: 2023-02-11 | Disposition: A | Discharge status: Home / Self Care | Payer: Medicare Other | Source: Ambulatory Visit | Attending: Physician Assistant

## 2023-02-11 VITALS — BP 130/90 | HR 81 | Ht 72.0 in | Wt 183.4 lb

## 2023-02-11 DIAGNOSIS — R9431 Abnormal electrocardiogram [ECG] [EKG]: Secondary | ICD-10-CM | POA: Diagnosis not present

## 2023-02-11 DIAGNOSIS — I2510 Atherosclerotic heart disease of native coronary artery without angina pectoris: Secondary | ICD-10-CM | POA: Diagnosis not present

## 2023-02-11 DIAGNOSIS — I2111 ST elevation (STEMI) myocardial infarction involving right coronary artery: Secondary | ICD-10-CM | POA: Insufficient documentation

## 2023-02-11 DIAGNOSIS — I5020 Unspecified systolic (congestive) heart failure: Secondary | ICD-10-CM | POA: Insufficient documentation

## 2023-02-11 DIAGNOSIS — E785 Hyperlipidemia, unspecified: Secondary | ICD-10-CM | POA: Diagnosis not present

## 2023-02-11 DIAGNOSIS — I509 Heart failure, unspecified: Secondary | ICD-10-CM | POA: Diagnosis present

## 2023-02-11 DIAGNOSIS — Z955 Presence of coronary angioplasty implant and graft: Secondary | ICD-10-CM | POA: Insufficient documentation

## 2023-02-11 DIAGNOSIS — E782 Mixed hyperlipidemia: Secondary | ICD-10-CM

## 2023-02-11 DIAGNOSIS — I255 Ischemic cardiomyopathy: Secondary | ICD-10-CM | POA: Diagnosis not present

## 2023-02-11 NOTE — Progress Notes (Signed)
Cardiology Office Note:  .   Date:  02/17/2023  ID:  Brandon Foster, DOB 10-11-1955, MRN [id number] PCP: Patient, No Pcp Per  [facility] Providers Cardiologist:  Bryan Lemma, MD   }   History of Present Illness: Brandon Foster   Brandon Foster is a 67 y.o. male we are seeing status post STEMI involving the right coronary artery, hyperlipidemia, HFrEF.  Echocardiogram dated 02/02/2023 EF of 25 to 30%.  Left heart cath 02/01/2023 proximal RCA to mid RCA lesion 100% stenosed, status post DES with Synergy XD 2.75 x 24 mm.  There is 0% residual stenosis postintervention.    The patient returned for staged PCI of the left circumflex and LAD  on February 03, 2023. A drug-eluting stent was successfully placed covering all 3 lesions, using a STENT ONYX FRONTIER 2.5 X 38 (see cath reports below). The patient is to stay on dual antiplatelet therapy with aspirin and ticagrelor beyond 12 months because of a new stent in RCA during ACS or Plavix/clopidogrel 75 mg grams daily to complete at least another year if not long-term.    He was fitted with a LifeVest prior to discharge due to severely reduced LVEF of 25 to 30%.  He was placed on guideline directed medical therapy although limited, as he initially was unable to tolerate metoprolol.  But apparently he was discharged on this.  He  review statin therapy and was referred to lipid clinic for consideration of PCSK9 inhibition. He is here for post hospital follow up.   He is without complaints of recurrent chest pain, he is slowly getting his energy and stamina back.  He has noticed that his breathing is better and he recovers more quickly after exerting himself.  He is not overexerting himself and would like to move forward with more aggressive exercise like walking on a treadmill.  He denies any overt bleeding or excessive bruising on Brilinta.  He is tolerating wearing the LifeVest and has not had any discharges.  ROS: As above otherwise negative  Studies Reviewed:  .   Cath: 02/01/2023     Culprit LESION prox RCA to Mid RCA lesion is 100% stenosed.  TIMI 0 flow   A drug-eluting stent was successfully placed using a SYNERGY XD 2.75X24.   Post intervention, there is a 0% residual stenosis.   --------------------------------------------------------   Prox LAD to Mid LAD lesion is 50% stenosed.   Lesion Segment #2: Mid LAD previously placed stent has a focal segment that is 80% stenosed, the remainder of the stent in the Mid LAD to Dist LAD is 45% stenosed. Dist LAD lesion is 70% stenosed, just beyond the stent.   Lesion #3: Prox Cx to Mid Cx lesion is 90% stenosed.\ into 2nd Mrg lesion is 70% stenosed.   LV end diastolic pressure is moderately elevated.   There is no aortic valve stenosis.    Diagnostic Dominance: Right  Intervention   Staged PCI 02/03/2023 Prox LAD to Mid LAD lesion is 50% stenosed.   LESION COMPLEX #2:  Mid LAD lesion is 80% stenosed. - In-stent Restenosis (overlapping stents); Mid LAD to Dist LAD lesion is 45% stenosed. -> ScoreFlex PTCA-DES PCI   Dist LAD lesion is 70% stenosed. - PTCA-DES PCI   A drug-eluting stent was successfully placed covering all 3 lesions, using a STENT ONYX FRONTIER 2.5X38.  Deployed to 2.6 mm, proximal 32 mm post-dilated to 3.1 mm. Post intervention, there is a 0% residual stenosis. TIMI 3 flow preserved.   -----------------------------------------------------------------------------  LESION COMPLEX #3: Prox Cx to Mid Cx lesion is 90% stenosed -> 2nd Mrg lesion is 70% stenosed.   A drug-eluting stent was successfully placed from Cx into 2nd Mrg, using a STENT ONYX FRONTIER 3.0X18.- >  Deployed to 3.2 mm and proximal 15 mm postdilated to 3.6 mm; Post intervention, there is a 0% residual stenosis. in the Cx-2ndMrg & Mid Cx to Dist Cx lesion is 85% stenosed (jailed by Stent). TIMI 3 flow preserved.   1st Mrg lesion is 80% stenosed -> Post intervention, there is a 100% residual stenosis. (Jailed by stent - TIMI  0 flow)   Mid Cx to Dist Cx lesion is 85% stenosed =>Balloon angioplasty was performed THROUGH the stent into the distal vessel, using a BALLN EMERGE MR 2.25X8. Post intervention, there is a 30% residual stenosis. TIMI 3 Flow   SUCCESSFUL LAD PCI - covering 80%, 45% (ISR) & 70% distal to prior stent -- Overlapping Onyx Frontier DES 2.5 x  38 mm (tapered post-dilation 3.1 -> 2.6 mm) - all small Diagonal & Septal branches remained open.  SUCCESSFUL LCx-OM2 PCI - crossing OM1 & dist LCx using Onyx Frontier 3.0 x 18 mm -> deployed to 3.3 mm & post-dilated to 3.6 mm; OM1 jailed -> 100% occluded (too small for PTCA), distLCx post-stent 85%-> reduced to 30% with PTCA. Diagnostic Dominance: Right  Intervention    _____________   EKG Interpretation Date/Time:  Monday February 17 2023 10:09:50 EST Ventricular Rate:  85 PR Interval:  154 QRS Duration:  84 QT Interval:  378 QTC Calculation: 449 R Axis:   -32  Text Interpretation: Normal sinus rhythm Right atrial enlargement Left axis deviation Inferior infarct (cited on or before 11-Feb-2023) Anterior infarct (cited on or before 11-Feb-2023) T wave abnormality, consider lateral ischemia When compared with ECG of 11-Feb-2023 10:10, Serial changes of Anterior infarct Present Confirmed by Joni Reining [phone]) on 02/17/2023 10:42:43 AM    Physical Exam:   VS:  BP 128/78 (BP Location: Right Arm, Patient Position: Sitting, Cuff Size: Normal)   Pulse 85   Ht 6' (1.829 m)   Wt 182 lb 6.4 oz (82.7 kg)   SpO2 97%   BMI 24.74 kg/m    Wt Readings from Last 3 Encounters:  02/17/23 182 lb 6.4 oz (82.7 kg)  02/11/23 183 lb 6.4 oz (83.2 kg)  02/03/23 182 lb 1.6 oz (82.6 kg)    GEN: Well nourished, well developed in no acute distress, wearing LifeVest monitor NECK: No JVD; No carotid bruits CARDIAC: RRR, no murmurs, rubs, gallops RESPIRATORY:  Clear to auscultation without rales, wheezing or rhonchi  ABDOMEN: Soft, non-tender,  non-distended EXTREMITIES:  No edema; No deformity old ecchymosis at the cardiac cath insertion site right wrist.  ASSESSMENT AND PLAN: .    Coronary artery disease: Multiple stents, proximal to mid RCA, staged PCI of left circumflex and LAD, covering 3 lesions, with 1 stent to the circumflex into the second marginal, second stent into first marginal, with PTCA and angioplasty of the mid circumflex to distal circumflex.  He is currently on Brilinta and aspirin, will continue this for an additional week and then start clopidogrel 75 mg and aspirin thereafter.  He has tolerated the medication well.  He is also on low-dose metoprolol.  2.  Ischemic cardiomyopathy: Echocardiogram EF 25 to 30%.  He now has a LifeVest in place.  I have talked with him about GDMT to include Entresto and Jardiance to allow for better recovery of LVEF.  The  patient refuses at this time, stating that it is "cost prohibitive."  I have explained to him that this would be beneficial to him in order to help with return of LV function.  I have also explained that there are programs available to assist him with cost.  At this time he would prefer not to be placed on these additional medications.   3.  Hypercholesterolemia: Several questions have been answered concerning low-cholesterol diet, removal of sugar, alcohol, and other inflammatory ingested foods.  Goal of LDL is less than 70.  During recent hospitalization total cholesterol on 02/02/2023 was 226, HDL 34 LDL 169.  He is intolerant of statin therapy.  He is being referred to lipid clinic for discussion of Repatha.  He does have an appointment on March 13, 2023.    He will follow-up with Dr. Herbie Baltimore on next appointment.  He will be due for repeat echocardiogram in 3 months along with repeat lipids and LFTs once he is started on PCSK9 inhibition if he accepts it.  I would like for him to be on GDMT however he is reluctant to do so due to cost of medications.  He may have a  longer recovery as a result.  He verbalizes understanding.  Signed, Bettey Mare. Liborio Nixon, ANP, AACC

## 2023-02-11 NOTE — Patient Instructions (Addendum)
  Education:  4 Heart Failure Medications  Beta blocker Metoprolol  Angiotensin Receptor Blocker/ Angiotensin Receptor Neprilysin Inhibitor Losartan  Valsartan Entresto (sacbuitril/valsartan)  Mineralocorticoid Receptor Antagonist Spironolactone Eplerenone  SGLT2 inhibitor Farixga  Jardiance  Follow-Up in: AS NEEDED   At the [facility], you and your health needs are our priority. We have a designated team specialized in the treatment of Heart Failure. This Care Team includes your primary Heart Failure Specialized Cardiologist (physician), Advanced Practice Providers (APPs- Physician Assistants and Nurse Practitioners), and Pharmacist who all work together to provide you with the care you need, when you need it.   You may see any of the following providers on your designated Care Team at your next follow up:  Dr. Arvilla Meres Dr. Marca Ancona Dr. Dorthula Nettles Dr. Theresia Bough Tonye Becket, NP Robbie Lis, [place] [facility] [place], [place] Brynda Peon, NP [place] Lee, NP Karle Plumber, PharmD   Please be sure to bring in all your medications bottles to every appointment.   Need to Contact us:  If you have any questions or concerns before your next appointment please send [place] a message through [place] or call our office at [phone].    TO LEAVE A MESSAGE FOR THE NURSE SELECT OPTION 2, PLEASE LEAVE A MESSAGE INCLUDING: YOUR NAME DATE OF BIRTH CALL BACK NUMBER REASON FOR CALL**this is important as we prioritize the call backs  YOU WILL RECEIVE A CALL BACK THE SAME DAY AS LONG AS YOU CALL BEFORE 4:00 PM

## 2023-02-11 NOTE — Progress Notes (Signed)
HEART & VASCULAR TRANSITION OF CARE CONSULT NOTE     Referring Physician: Dr. Clifton James Primary Care: N/A Primary Cardiologist: Dr. Herbie Baltimore  HPI: Referred to clinic by Dr. Clifton James for heart failure consultation. 67 y.o. male with history of MI with placement of stent LAD  , hx ETOH abuse.   Presented with inferior STEMI 02/01/23. Found to have 100% proximal RCA treated with PCI/DES. He did have distal embolization to RPL branch treated with balloon angioplasty. Two days later had DES to LAD covering 80% m LAD, in-stent restenosis m to d LAD, 70% d LAD lesions, PCI/DES Lcx in to OM2, 100% OM1 noted post intervention stent jailed, 80% d Lcx treated with PTCA. Echo with EF 25-30%. GDMT limited by low blood pressure. Fitted for LifeVest prior to discharge.   Here today for hospital follow-up. Accompanied by his significant other. He has been doing well from cardiac standpoint. No chest pain or palpitations. He had a little bit of shortness of breath initially after discharge but feels that he has been improving significantly. He has been going for walks and has lost 6 lbs already with changes to his diet.  No lower extremity edema, orthopnea or PND. Has a little fatigue with metoprolol but overall tolerating medications well.  He is a retired Art gallery manager.   Has not used tobacco in 25 years, consumes 2-3 alcoholic beverages a week.     Past Medical History:  Diagnosis Date   Alcohol abuse    Depression    History of chickenpox     Current Outpatient Medications  Medication Sig Dispense Refill   Ascorbic Acid (VITAMIN C PO) Take 1 tablet by mouth daily.     aspirin 81 MG chewable tablet Chew 1 tablet (81 mg total) by mouth daily. 90 tablet 2   b complex vitamins capsule Take 1 capsule by mouth daily.     metoprolol succinate (TOPROL-XL) 25 MG 24 hr tablet Take 1 tablet (25 mg total) by mouth daily. 90 tablet 1   tadalafil (CIALIS) 5 MG tablet Take 5 mg by mouth daily.     testosterone  cypionate (DEPOTESTOTERONE CYPIONATE) 100 MG/ML injection Inject 50 mg into the muscle See admin instructions. Inject 50mg  into the muscle twice a week.     ticagrelor (BRILINTA) 90 MG TABS tablet Take 1 tablet (90 mg total) by mouth 2 (two) times daily. 180 tablet 2   VITAMIN D PO Take 1 tablet by mouth daily.     VITAMIN E PO Take 1 capsule by mouth daily.     nitroGLYCERIN (NITROSTAT) 0.4 MG SL tablet Place 1 tablet (0.4 mg total) under the tongue every 5 (five) minutes as needed. (Patient not taking: Reported on 02/11/2023) 25 tablet 2   No current facility-administered medications for this encounter.    Allergies  Allergen Reactions   Banana     Causes cramping      Social History   Socioeconomic History   Marital status: Married    Spouse name: Not on file   Number of children: 1   Years of education: Not on file   Highest education level: Associate degree: academic program  Occupational History   Occupation: Retired  Tobacco Use   Smoking status: Former   Smokeless tobacco: Never  Advertising account planner   Vaping status: Never Used  Substance and Sexual Activity   Alcohol use: Yes    Alcohol/week: 4.0 standard drinks of alcohol    Types: 4 Cans of beer per week  Comment: 2-3 beers per week   Drug use: No   Sexual activity: Not on file  Other Topics Concern   Not on file  Social History Narrative   Not on file   Social Determinants of Health   Financial Resource Strain: Medium Risk (02/03/2023)   Overall Financial Resource Strain (CARDIA)    Difficulty of Paying Living Expenses: Somewhat hard  Food Insecurity: No Food Insecurity (02/01/2023)   Hunger Vital Sign    Worried About Running Out of Food in the Last Year: Never true    Ran Out of Food in the Last Year: Never true  Transportation Needs: No Transportation Needs (02/01/2023)   PRAPARE - Administrator, Civil Service (Medical): No    Lack of Transportation (Non-Medical): No  Physical Activity: Not  on file  Stress: Not on file  Social Connections: Not on file  Intimate Partner Violence: Not At Risk (02/01/2023)   Humiliation, Afraid, Rape, and Kick questionnaire    Fear of Current or Ex-Partner: No    Emotionally Abused: No    Physically Abused: No    Sexually Abused: No      Family History  Problem Relation Age of Onset   Hypertension Father    Alcohol abuse Father    Alcohol abuse Sister    Alcohol abuse Paternal Grandmother    Diabetes Neg Hx     Vitals:   02/11/23 1009  BP: (!) 130/90  Pulse: 81  SpO2: 98%  Weight: 83.2 kg (183 lb 6.4 oz)  Height: 6' (1.829 m)    PHYSICAL EXAM: General:  Well appearing. HEENT: normal Neck: supple. no JVD.  Cor: PMI nondisplaced. Regular rate & rhythm. No rubs, gallops or murmurs. Lungs: clear Abdomen: soft, nontender, nondistended.  Extremities: no cyanosis, clubbing, rash, edema Neuro: alert & oriented x 3. moves all 4 extremities w/o difficulty. Affect pleasant.  ECG: NSR 86 bpm,    ASSESSMENT & PLAN: HFrEF/ischemic cardiomyopathy -Echo 11/24: EF 25-30%, RV okay, mild MR -LVEDP 21-22 mmHg at time of [facility] 11/24 -See below regarding CAD -NYHA II. No volume overload. Not requiring diuretic. -Continue metoprolol xl 25 mg daily -We discussed the indication for 4 pillars of GDMT in management of HFrEF. He is concerned about adverse effects associated with the combination of multiple medications. Not interested in adding new medicines today. If EF remains reduced on subsequent echo he may be willing to reconsider. -Wearing LifeVest.  CAD with recent inferior STEMI -Inferior STEMI 02/01/23. LHC w/ 100% proximal RCA treated with PCI/DES. He did have distal embolization to RPL branch treated with balloon angioplasty.  -11/18 had DES to LAD covering 80% m LAD, in-stent restenosis m to d LAD, 70% d LAD lesions, PCI/DES Lcx in to OM2, 100% OM1 noted post intervention stent jailed, 80% d Lcx treated with PTCA -On aspirin and  brilinta. -Planning to discuss transition from brilinta to plavix after 30 days at f/u with Cardiology. This is due to cost. -Continue metoprolol xl -Avoid use of nitroglycerin when taking cialis, he is aware of this -Denies angina  Hyperlipidemia -LDL 169 -Does not want to take a statin -Has appointment with Pharmacy to discuss PCSK9 inhibitor   Referred to HFSW (PCP, Medications, Transportation, ETOH Abuse, Drug Abuse, Insurance, Financial ): No Refer to Pharmacy: No Refer to Home Health: No Refer to Advanced Heart Failure Clinic: No, he would prefer to follow-up with just Cardiology at this time Refer to General Cardiology: No, already established  Follow up:  As needed. F/u with Cardiology as scheduled 12/02.

## 2023-02-12 ENCOUNTER — Ambulatory Visit: Payer: Medicare Other | Admitting: Adult Health

## 2023-02-17 ENCOUNTER — Encounter: Payer: Self-pay | Admitting: Adult Health

## 2023-02-17 ENCOUNTER — Ambulatory Visit: Payer: Medicare Other | Attending: Adult Health | Admitting: Adult Health

## 2023-02-17 VITALS — BP 128/78 | HR 85 | Ht 72.0 in | Wt 182.4 lb

## 2023-02-17 DIAGNOSIS — I25110 Atherosclerotic heart disease of native coronary artery with unstable angina pectoris: Secondary | ICD-10-CM | POA: Insufficient documentation

## 2023-02-17 DIAGNOSIS — E7800 Pure hypercholesterolemia, unspecified: Secondary | ICD-10-CM | POA: Diagnosis not present

## 2023-02-17 DIAGNOSIS — I5020 Unspecified systolic (congestive) heart failure: Secondary | ICD-10-CM | POA: Insufficient documentation

## 2023-02-17 MED ORDER — CLOPIDOGREL BISULFATE 75 MG PO TABS: 75.0000 mg | ORAL_TABLET | Freq: Every day | ORAL | 3 refills | Status: DC

## 2023-02-17 NOTE — Patient Instructions (Signed)
Medication Instructions:  Stop Brilinta Start Plavix 75 mg ( Take 1 Tablet Daily). *If you need a refill on your cardiac medications before your next appointment, please call your pharmacy*   Lab Work: No Labs If you have labs (blood work) drawn today and your tests are completely normal, you will receive your results only by: MyChart Message (if you have MyChart) OR A paper copy in the mail If you have any lab test that is abnormal or we need to change your treatment, we will call you to review the results.   Testing/Procedures: No Testing   Follow-Up: At [facility], you and your health needs are our priority.  As part of our continuing mission to provide you with exceptional heart care, we have created designated Provider Care Teams.  These Care Teams include your primary Cardiologist (physician) and Advanced Practice Providers (APPs -  Physician Assistants and Nurse Practitioners) who all work together to provide you with the care you need, when you need it.  We recommend signing up for the patient portal called "MyChart".  Sign up information is provided on this After Visit Summary.  MyChart is used to connect with patients for Virtual Visits (Telemedicine).  Patients are able to view lab/test results, encounter notes, upcoming appointments, etc.  Non-urgent messages can be sent to your provider as well.   To learn more about what you can do with MyChart, go to [email or web address].    Your next appointment:   6 week(s)  Provider:   Bryan Lemma, MD

## 2023-02-24 ENCOUNTER — Telehealth: Payer: Self-pay | Admitting: *Deleted

## 2023-02-24 NOTE — Telephone Encounter (Signed)
Called left message per last visit needs a follow up appointment - schedule appointment with Dr Herbie Baltimore 03/31/23  at 2:00 pm  Please arrive by 1:40 pm  If unavailable please call the office to reschedule

## 2023-03-13 ENCOUNTER — Encounter: Payer: Self-pay | Admitting: Pharmacy Technician

## 2023-03-13 ENCOUNTER — Ambulatory Visit: Payer: Medicare Other | Attending: Internal Medicine | Admitting: Student

## 2023-03-13 ENCOUNTER — Other Ambulatory Visit (HOSPITAL_COMMUNITY): Payer: Self-pay

## 2023-03-13 ENCOUNTER — Telehealth: Payer: Self-pay | Admitting: Pharmacist

## 2023-03-13 ENCOUNTER — Encounter: Payer: Self-pay | Admitting: Student

## 2023-03-13 DIAGNOSIS — E782 Mixed hyperlipidemia: Secondary | ICD-10-CM | POA: Insufficient documentation

## 2023-03-13 NOTE — Patient Instructions (Signed)
Your Results:             Your most recent labs Goal  Total Cholesterol 225 < 200  Triglycerides 112 < 150  HDL (happy/good cholesterol) 34 > 40  LDL (lousy/bad cholesterol 169 < 70   We discussed today Crestor, ezetimibe and injectable ( Repatha or Praluent or inclisiran) to lower LDL. Other lab test that can be useful - Lp(a) you can find out from your insurance about the coverage  Follow up lipid lab is due Feb 13 to see how lifestyle meds are helping to lower LDL   Praluent is a cholesterol medication that improved your body's ability to get rid of "bad cholesterol" known as LDL. It can lower your LDL up to 60%. It is an injection that is given under the skin every 2 weeks. The most common side effects of Praluent include runny nose, symptoms of the common cold, rarely flu or flu-like symptoms, back/muscle pain in about 3-4% of the patients, and redness, pain, or bruising at the injection site.    Repatha is a cholesterol medication that improved your body's ability to get rid of "bad cholesterol" known as LDL. It can lower your LDL up to 60%! It is an injection that is given under the skin every 2 weeks. The most common side effects of Repatha include runny nose, symptoms of the common cold, rarely flu or flu-like symptoms, back/muscle pain in about 3-4% of the patients, and redness, pain, or bruising at the injection site.   Lab orders: We want to repeat labs after 2-3 months.  We will send you a lab order to remind you once we get closer to that time.

## 2023-03-13 NOTE — Progress Notes (Signed)
Patient ID: Brandon Foster                 DOB: 1956/01/29                    MRN: [id number]      HPI: Brandon Foster is a 67 y.o. male patient referred to lipid clinic by  Dr.Harding . PMH is significant for STEMI 02/01/23 s/p PCI/DES,Echo with EF 25-30%. GDMT limited by low blood pressure. Patient refuse to take statins   Patient presented today for lipid clinic. He had tried simvastatin in the past wich he could not tolerate due to severe myalgia. It was affecting his mobility PCP at that time did CK level check it was normal. He does not like statin and not willing to try any other statins. He has made some major changes to his diet and he exercise regularly after his recent  MI event. He believes in natural way to manage condition so want to wait and see the lifestyle interventions that he has implement how much it lower the LDLc want to recheck lipid panel mid Feb and willing to try non statin options in the future  Reviewed options for lowering LDL cholesterol, including,statins, ezetimibe, PCSK-9 inhibitors, bempedoic acid and inclisiran.  Discussed mechanisms of action, dosing, side effects and potential decreases in LDL cholesterol.  Also reviewed cost information and potential options for patient assistance.  Current Medications: none  Intolerances: simvastatin - severe myalgia  Risk Factors:premature CAD, 1st MI at age of 54, STEMI 02/01/23 s/p PCI/DES,, HLD LDL goal: <55 mg/dl   Diet:  Breakfast: oats, egg white and whole wheat toast  Lunch: lean meat -crackers, salads - ranch  Snack: cheese  Dinner: grilled chicken wings, salads, potatoes,  Drink: sweeten ice tea, 2% milk, pomegranate juice 1 cap per day, water  6-8 glass per day   Exercise: 30 min brisk walk in treadmill everyday, lift weights twice week 10-300 lbs resistance bands  Will be starting bike riding and hiking   Family History:  Relation Problem Comments  Mother (Deceased)   Father (Deceased) Alcohol abuse, smoking     Hypertension, stroke in his late 56's      Sister Metallurgist) Alcohol abuse, heart problems, hyperlipidemia, MI at age 105      Paternal Grandmother Alcohol abuse    Maternal grandmother               heart disease  Social History:   Labs: Lipid Panel     Component Value Date/Time   CHOL 225 (H) 02/02/2023 0305   TRIG 112 02/02/2023 0305   HDL 34 (L) 02/02/2023 0305   CHOLHDL 6.6 02/02/2023 0305   VLDL 22 02/02/2023 0305   LDLCALC 169 (H) 02/02/2023 0305    Past Medical History:  Diagnosis Date   Alcohol abuse    Depression    History of chickenpox     Current Outpatient Medications on File Prior to Visit  Medication Sig Dispense Refill   Ascorbic Acid (VITAMIN C PO) Take 1 tablet by mouth daily.     aspirin 81 MG chewable tablet Chew 1 tablet (81 mg total) by mouth daily. 90 tablet 2   b complex vitamins capsule Take 1 capsule by mouth daily.     clopidogrel (PLAVIX) 75 MG tablet Take 1 tablet (75 mg total) by mouth daily. 90 tablet 3   metoprolol succinate (TOPROL-XL) 25 MG 24 hr tablet Take 1 tablet (25 mg total) by mouth  daily. 90 tablet 1   nitroGLYCERIN (NITROSTAT) 0.4 MG SL tablet Place 1 tablet (0.4 mg total) under the tongue every 5 (five) minutes as needed. 25 tablet 2   tadalafil (CIALIS) 5 MG tablet Take 5 mg by mouth daily.     testosterone cypionate (DEPOTESTOTERONE CYPIONATE) 100 MG/ML injection Inject 50 mg into the muscle See admin instructions. Inject 50mg  into the muscle twice a week.     VITAMIN D PO Take 1 tablet by mouth daily.     VITAMIN E PO Take 1 capsule by mouth daily.     No current facility-administered medications on file prior to visit.    Allergies  Allergen Reactions   Banana     Causes cramping    Assessment/Plan:  1. Hyperlipidemia -  Problem  Hyperlipidemia   Hyperlipidemia Assessment and plan :  LDL goal: <169 mg/dl last LDLc 55 mg/dl (16/1096) Implemented healthy diet and regular exercise post MI (01/2023) does not want to  pursue with any therapy want to see effect of life style interventions - f/u lipid lab in Feb 2025  Currently not on any LDLc lowering therapy  Intolerance to statins -simvastatin caused severe myalgia  Discussed next potential options (statins, Zetia, PCSK-9 inhibitors, bempedoic acid and inclisiran); cost, dosing efficacy, side effects  Reluctant to try any statin. Will assess PCSK9i coverage  Implemented healthy diet and regular exercise post MI (01/2023) does not want to pursue with any therapy want to see effect of life style interventions - f/u lipid lab in Feb 2025  Made patient aware at current LDLc level and given goal due to risk factors,one agent may not be sufficient to lower LDL at goal      Thank you,  Carmela Hurt, Pharm.D [facility] A Division of [facility] [address] [place], [place] [address]  Phone: [phone]; Fax: [phone]

## 2023-03-13 NOTE — Assessment & Plan Note (Signed)
Assessment and plan :  LDL goal: <169 mg/dl last LDLc 55 mg/dl (56/3875) Implemented healthy diet and regular exercise post MI (01/2023) does not want to pursue with any therapy want to see effect of life style interventions - f/u lipid lab in Feb 2025  Currently not on any LDLc lowering therapy  Intolerance to statins -simvastatin caused severe myalgia  Discussed next potential options (statins, Zetia, PCSK-9 inhibitors, bempedoic acid and inclisiran); cost, dosing efficacy, side effects  Reluctant to try any statin. Will assess PCSK9i coverage  Implemented healthy diet and regular exercise post MI (01/2023) does not want to pursue with any therapy want to see effect of life style interventions - f/u lipid lab in Feb 2025  Made patient aware at current LDLc level and given goal due to risk factors,one agent may not be sufficient to lower LDL at goal

## 2023-03-13 NOTE — Telephone Encounter (Signed)
PCSK9i coverage assessment request submitted to the pool

## 2023-03-13 NOTE — Telephone Encounter (Signed)
error 

## 2023-03-14 ENCOUNTER — Telehealth: Payer: Self-pay | Admitting: Pharmacy Technician

## 2023-03-14 NOTE — Telephone Encounter (Signed)
Waiting on insurance information from patient

## 2023-03-25 NOTE — Telephone Encounter (Signed)
 Call patient and LM requesting to provide with updated insurance information.

## 2023-03-26 LAB — LIPID PANEL
Chol/HDL Ratio: 6.4 {ratio} — ABNORMAL HIGH (ref 0.0–5.0)
Cholesterol, Total: 238 mg/dL — ABNORMAL HIGH (ref 100–199)
HDL: 37 mg/dL — ABNORMAL LOW (ref >39)
LDL Chol Calc (NIH): 176 mg/dL — ABNORMAL HIGH (ref 0–99)
Triglycerides: 135 mg/dL (ref 0–149)
VLDL Cholesterol Cal: 25 mg/dL (ref 5–40)

## 2023-03-31 ENCOUNTER — Encounter: Payer: Self-pay | Admitting: Cardiology

## 2023-03-31 ENCOUNTER — Ambulatory Visit: Payer: Medicare Other | Attending: Cardiology | Admitting: Cardiology

## 2023-03-31 VITALS — BP 131/88 | HR 99 | Ht 72.0 in | Wt 177.6 lb

## 2023-03-31 DIAGNOSIS — T466X5A Adverse effect of antihyperlipidemic and antiarteriosclerotic drugs, initial encounter: Secondary | ICD-10-CM | POA: Diagnosis present

## 2023-03-31 DIAGNOSIS — I5020 Unspecified systolic (congestive) heart failure: Secondary | ICD-10-CM | POA: Insufficient documentation

## 2023-03-31 DIAGNOSIS — I255 Ischemic cardiomyopathy: Secondary | ICD-10-CM | POA: Insufficient documentation

## 2023-03-31 DIAGNOSIS — I2510 Atherosclerotic heart disease of native coronary artery without angina pectoris: Secondary | ICD-10-CM | POA: Insufficient documentation

## 2023-03-31 DIAGNOSIS — E785 Hyperlipidemia, unspecified: Secondary | ICD-10-CM | POA: Diagnosis present

## 2023-03-31 DIAGNOSIS — I2119 ST elevation (STEMI) myocardial infarction involving other coronary artery of inferior wall: Secondary | ICD-10-CM | POA: Insufficient documentation

## 2023-03-31 DIAGNOSIS — Z9861 Coronary angioplasty status: Secondary | ICD-10-CM | POA: Diagnosis present

## 2023-03-31 DIAGNOSIS — I25110 Atherosclerotic heart disease of native coronary artery with unstable angina pectoris: Secondary | ICD-10-CM | POA: Insufficient documentation

## 2023-03-31 DIAGNOSIS — R030 Elevated blood-pressure reading, without diagnosis of hypertension: Secondary | ICD-10-CM | POA: Diagnosis present

## 2023-03-31 DIAGNOSIS — G720 Drug-induced myopathy: Secondary | ICD-10-CM | POA: Diagnosis present

## 2023-03-31 DIAGNOSIS — I2111 ST elevation (STEMI) myocardial infarction involving right coronary artery: Secondary | ICD-10-CM

## 2023-03-31 MED ORDER — ROSUVASTATIN CALCIUM 10 MG PO TABS
10.0000 mg | ORAL_TABLET | Freq: Every day | ORAL | 6 refills | Status: DC
Start: 2023-03-31 — End: 2023-06-23

## 2023-03-31 MED ORDER — VALSARTAN 40 MG PO TABS: 40.0000 mg | ORAL_TABLET | Freq: Every day | ORAL | 3 refills | Status: DC

## 2023-03-31 NOTE — Patient Instructions (Addendum)
 Medication Instructions:  START Valsartan  40 mg daily   START Rosuvastatin  10 mg      - Start 10 mg every other day for 2 weeks. IF tolerating well, then continue 10 mg daily.  IF NOT tolerating well, then stop taking for 1 week. Restart after week off to a decreased dose of 5 mg (1/2 tablet) every other day.   CONTINUE all other medications     *If you need a refill on your cardiac medications before your next appointment, please call your pharmacy*  Lab Work: FASTING LIPID PANEL in 2 months If you have labs (blood work) drawn today and your tests are completely normal, you will receive your results only by: MyChart Message (if you have MyChart) OR A paper copy in the mail If you have any lab test that is abnormal or we need to change your treatment, we will call you to review the results.   Testing/Procedures: Echo will be scheduled at [facility] [address] (done in March 2025)  Your physician has requested that you have an echocardiogram. Echocardiography is a painless test that uses sound waves to create images of your heart. It provides your doctor with information about the size and shape of your heart and how well your heart's chambers and valves are working. This procedure takes approximately one hour. There are no restrictions for this procedure. Please do NOT wear cologne, perfume, aftershave, or lotions (deodorant is allowed). Please arrive 15 minutes prior to your appointment time.   Follow-Up: At [facility], you and your health needs are our priority.  As part of our continuing mission to provide you with exceptional heart care, we have created designated Provider Care Teams.  These Care Teams include your primary Cardiologist (physician) and Advanced Practice Providers (APPs -  Physician Assistants and Nurse Practitioners) who all work together to provide you with the care you need, when you need it.  Your next appointment:   3-4 month(s)  Provider:    Alm Clay, MD

## 2023-03-31 NOTE — Progress Notes (Signed)
 Cardiology Office Note:  .   Date:  04/03/2023  ID:  Lorrene Evangelist, DOB 1955/04/13, MRN [id number] PCP: Patient, No Pcp Per  [facility] Providers Cardiologist:  Alm Clay, MD     Chief Complaint  Patient presents with   Follow-up    ~2 month   Coronary Artery Disease    Recent STEMI with three-vessel PCI   Cardiomyopathy    Ischemic cardiomyopathy after MI with reduced EF.  No CHF    Patient Profile: .     Millan Legan is a  68 y.o. male former smoker with a PMH notable for CAD (prior LAD PCI - with overlapping DES in ~2010) with recent Inferolateral STEMI (emergent PCI of the RCA followed by staged LAD and LCx PCI, jailing small OM1) with severe ICM-EF 25 to 30%.  Who presents here for 2 month follow-up as second posthospital visit since STEMI  Inferolateral STEMI 02/01/2023: LCx PCI (Synergy XD 2.75 mm x 24 mm - 3.1 mm) =-> briefly in cardiogenic shock Staged PCI mid LAD and proximal-mid LCx 02/03/2023:  SUCCESSFUL LAD PCI - covering 80%, 45% (ISR) & 70% distal to prior stent -- Overlapping Onyx Frontier DES 2.5 x  38 mm (tapered post-dilation 3.1 -> 2.6 mm) - all small Diagonal & Septal branches remained open.  SUCCESSFUL LCx-OM2 PCI - crossing OM1 & dist LCx using Onyx Frontier 3.0 x 18 mm -> deployed to 3.3 mm & post-dilated to 3.6 mm; OM1 jailed -> 100% occluded (too small for PTCA), distLCx post-stent 85%-> reduced to 30% with PTCA.  Echo 02/02/2023: EF 25 to 30%.  Mid anteroseptal basal inferolateral and basal anterolateral as well as mid inferoseptal and mid inferior and basal inferoseptal akinesis. Discharge with LifeVest Prior authorization for PCSK9 Inhibitor initiated    Donald Memoli was seen by Lamarr Satterfield, NP on 02/17/2023 for initial posthospital TOC follow-up.  No recurrent chest pain.  Slowly regaining energy and stamina.  Breathing much improved, recovering more quickly after exertion.  Still not overexerting himself.  No bleeding.  Subjective   Discussed the use of AI scribe software for clinical note transcription with the patient, who gave verbal consent to proceed.  History of Present Illness   The patient, with a history of coronary artery disease (prior PCI to LAD 2t5 yrs ago & overlapping PCI -LAD 10 yrs ago - one episode associated with reduced LVEF & LV mural thrombus).  He presents for his 2nd Post-Hospital Follow-up consultation after a recent Inferior STEMI in mid-November. . The most recent cardiac event resulted in stenting of the right coronary artery (RCA), the LAD, and the circumflex artery, resulting in stents in all three major arteries.  The patient reported a significant improvement in physical capacity since the recent cardiac event, including resistance training and treadmill intervals. However, the patient also acknowledged a decrease in pump function, with an ejection fraction (EF) of 20-25% post-myocardial infarction. Despite this, the patient reported no chest pain or pressure, no shortness of breath, no swelling in the legs, and no heart palpitations. The patient did report occasional minor nosebleeds.  The patient's lipid profile was a concern, with a high low-density lipoprotein (LDL) level of 176. The patient expressed a desire to manage this through lifestyle modifications, including weight loss and dietary changes, but was open to starting a statin medication. The patient had a history of statin intolerance, with previous experiences of muscle aches. (He says he has tried at least 2 statins).    In general,  he has an aversion to medications, but at the same time days he will follow my lead.  He is very thankful of the care he received with his MI and acknowledges how lucky he was when he realized that on initial presentation, he was in cardiogenic shock requiring pressors.   The patient also reported a history of hypertension, diabetes mellitus type II, hyperlipidemia, and chronic kidney disease stage 3. The  patient was currently on aspirin  and Plavix  for antiplatelet therapy. The patient had no history of cerebrovascular accident, head bleed, or stroke.      Cardiovascular ROS: no chest pain or dyspnea on exertion negative for - edema, irregular heartbeat, orthopnea, palpitations, paroxysmal nocturnal dyspnea, rapid heart rate, shortness of breath, or rapid/irregular heartbeats, syncope/near syncope, TIA/amaurosis fugax; claudication  ROS:  Review of Systems - Negative except Sx in HPI    Objective   Studies Reviewed: SABRA   EKG Interpretation Date/Time:  Monday March 31 2023 14:03:48 EST Ventricular Rate:  99 PR Interval:  148 QRS Duration:  90 QT Interval:  334 QTC Calculation: 428 R Axis:   -53  Text Interpretation: Sinus rhythm with occasional Premature ventricular complexes and Premature atrial complexes Left axis deviation Inferior infarct (cited on or before 11-Feb-2023) Anterior infarct (cited on or before 11-Feb-2023) When compared with ECG of 17-Feb-2023 10:09, Premature ventricular complexes are now Present Premature atrial complexes are now Present Questionable change in initial forces of Inferior leads Anterior T wave inversion NO LONGER PRESENT Confirmed by Anner Lenis ([id number]) on 03/31/2023 2:15:59 PM    ECHO 02/02/2023 (post MI): EF 25-30%: RWMA  CATH  11/17 & 02/03/2023: 100% RCA -LAD & LCX severe disease --> Emergent RCA PCI & staged LAD / LCX PCI Diagnostic Dominance: Right         Intervention + Staged PCI   Lab Results  Component Value Date   CHOL 238 (H) 03/25/2023   HDL 37 (L) 03/25/2023   LDLCALC 176 (H) 03/25/2023   TRIG 135 03/25/2023   CHOLHDL 6.4 (H) 03/25/2023   Lab Results  Component Value Date   HGBA1C 5.4 02/02/2023   Lab Results  Component Value Date   NA 138 02/04/2023   K 3.7 02/04/2023   CREATININE 1.06 02/04/2023   GFRNONAA >60 02/04/2023   GLUCOSE 92 02/04/2023   Lab Results  Component Value Date   ALT 33 02/01/2023   AST 26  02/01/2023   ALKPHOS 47 02/01/2023   BILITOT 0.7 02/01/2023     Risk Assessment/Calculations:          Physical Exam:   VS:  BP 131/88 (BP Location: Left Arm, Patient Position: Sitting, Cuff Size: Normal)   Pulse 99   Ht 6' (1.829 m)   Wt 177 lb 9.6 oz (80.6 kg)   SpO2 97%   BMI 24.09 kg/m    Wt Readings from Last 3 Encounters:  03/31/23 177 lb 9.6 oz (80.6 kg)  02/17/23 182 lb 6.4 oz (82.7 kg)  02/11/23 183 lb 6.4 oz (83.2 kg)    GEN: Well nourished, well groomed in no acute distress; healthy appearing NECK: No JVD; No carotid bruits CARDIAC: Normal S1, S2; RRR, no murmurs, rubs, gallops RESPIRATORY:  Clear to auscultation without rales, wheezing or rhonchi ; nonlabored, good air movement. ABDOMEN: Soft, non-tender, non-distended EXTREMITIES:  No edema; No deformity      ASSESSMENT AND PLAN: .    Problem List Items Addressed This Visit       Cardiology Problems  CAD S/P percutaneous coronary angioplasty   Inferior STEMI with RCA PCI followed by staged PCI of the LAD and LCx.  Multiple stents, would likely need to be on uninterrupted DAPT for minimum a year but likely continue Thienopyridine monotherapy for lifetime.   Currently on Plavix  and low-dose aspirin . Discussed the possibility of Plavix  non-reactivity and potential switch to Effient  if PTY12 inhibition assay shows Plavix  is not effective. -Continue Plavix  and low-dose aspirin . -Consider PTY12 inhibition assay to assess Plavix  effectiveness.      Relevant Medications   valsartan  (DIOVAN ) 40 MG tablet   rosuvastatin  (CRESTOR ) 10 MG tablet   Other Relevant Orders   ECHOCARDIOGRAM COMPLETE   Coronary artery disease involving native coronary artery of native heart with unstable angina pectoris (HCC) - Primary (Chronic)   Admitted recently large inferior STEMI underwent RCA PCI followed by LAD and then LCx PCI.   No longer having any active anginal symptoms. Has been reluctant to take any medications and says  he is feeling very well with no angina or heart failure. Currently only on low-dose Toprol  25 mg daily, 81 mg aspirin  and clopidogrel . -Has declined statin in the past and continues to decline.  Also declined other medications and did not titrate medications during last visit.  -Continue Toprol  25 mg daily and add Diovan  40 mg daily for afterload reduction -Start low-dose Crestor  to see if he tolerates and low threshold to consider PCSK9 inhibitor initiation      Relevant Medications   valsartan  (DIOVAN ) 40 MG tablet   rosuvastatin  (CRESTOR ) 10 MG tablet   Other Relevant Orders   EKG 12-Lead (Completed)   Lipid panel   HFrEF (heart failure with reduced ejection fraction) (HCC) (Chronic)   Despite having significantly reduced EF, NYHA class I symptoms.  Currently only on Toprol  25 mg daily. -Initiate Diovan  40 mg daily with plans to titrate up further and reassess EF.      Relevant Medications   valsartan  (DIOVAN ) 40 MG tablet   rosuvastatin  (CRESTOR ) 10 MG tablet   History of ST elevation myocardial infarction (STEMI) of inferior wall (HCC) (Chronic)   Sizable infarct with RCA occlusion as etiology exacerbated by the existing LAD and circumflex disease.  Reduced EF but no active angina or heart failure symptoms after discharge.      Relevant Medications   valsartan  (DIOVAN ) 40 MG tablet   rosuvastatin  (CRESTOR ) 10 MG tablet   Hyperlipidemia with target low density lipoprotein (LDL) cholesterol less than 55 mg/dL (Chronic)   LDL significantly elevated at 176. Discussed the need for aggressive lipid-lowering therapy given the patient's extensive coronary artery disease. Patient to start on Crestor  with a plan to titrate up if tolerated. -Start Crestor  10mg  every other day for two weeks. If tolerated, increase to 10mg  daily. If not tolerated, stop for one week then restart at 5mg  every other day. -Recheck lipid panel in two months. --> Anticipate that he probably will need an  injectable to get his LDL at goal.  -Will refer to Pharm.D. lipid clinic to consider cost assessment for Leqvio/inclisiran which would be helpful in the setting of someone who was not interested in doing injections or taking medications.  Twice yearly infusions would be an easier chore for him .      Relevant Medications   valsartan  (DIOVAN ) 40 MG tablet   rosuvastatin  (CRESTOR ) 10 MG tablet   Other Relevant Orders   Lipid panel   Ischemic cardiomyopathy (Chronic)   EF decreased to 30-35% from previous  50-55%. Discussed the need for goal-directed therapy to aid in recovery of pump function. Plan to start Diovan .  (He indicated that Sim was cost prohibitive. -Start Diovan  40mg  daily. -Recheck echo in two months to assess EF.      Relevant Medications   valsartan  (DIOVAN ) 40 MG tablet   rosuvastatin  (CRESTOR ) 10 MG tablet   Other Relevant Orders   ECHOCARDIOGRAM COMPLETE     Other   Elevated blood pressure reading without diagnosis of hypertension (Chronic)   Statin myopathy (Chronic)   In the past has been on multiple different statins although has not been on one recently.  Will need to reinitiate rosuvastatin  to determine how tolerates.  Recheck lipids and refer to our lipid clinic for initiation of PCSK9 better versus inclisiran.  I think actually in his case inclisiran/likely will be a great option.       Relevant Orders   Lipid panel      Follow-up Plan to reassess in 2 months with repeat echo and lipid panel. If statin therapy is not tolerated or LDL remains significantly elevated, consider PCSK9 inhibitor therapy.  Return in about 4 months (around 07/29/2023).  Total time spent: 44 min spent with patient + 19 min spent charting = 63 min I spent 63 minutes in the care of Lorrene Evangelist today including reviewing labs (2 min), reviewing studies (7 min), face to face time discussing treatment options (44 min), reviewing records from Admission & f/u PN (4 min), 6 min  dictating, and documenting in the encounter.    Signed, Alm MICAEL Clay, MD, MS Alm Clay, M.D., M.S. Interventional Cardiologist  [facility]  Pager # [phone] Phone # [phone] [address] [place], [place] [address]

## 2023-04-03 ENCOUNTER — Encounter: Payer: Self-pay | Admitting: Cardiology

## 2023-04-03 NOTE — Assessment & Plan Note (Signed)
EF decreased to 30-35% from previous 50-55%. Discussed the need for goal-directed therapy to aid in recovery of pump function. Plan to start Diovan.  (He indicated that Sherryll Burger was cost prohibitive. -Start Diovan 40mg  daily. -Recheck echo in two months to assess EF.

## 2023-04-03 NOTE — Assessment & Plan Note (Signed)
LDL significantly elevated at 176. Discussed the need for aggressive lipid-lowering therapy given the patient's extensive coronary artery disease. Patient to start on Crestor with a plan to titrate up if tolerated. -Start Crestor 10mg  every other day for two weeks. If tolerated, increase to 10mg  daily. If not tolerated, stop for one week then restart at 5mg  every other day. -Recheck lipid panel in two months. --> Anticipate that he probably will need an injectable to get his LDL at goal.  -Will refer to Pharm.D. lipid clinic to consider cost assessment for Leqvio/inclisiran which would be helpful in the setting of someone who was not interested in doing injections or taking medications.  Twice yearly infusions would be an easier "chore for him ".

## 2023-04-03 NOTE — Assessment & Plan Note (Signed)
Sizable infarct with RCA occlusion as etiology exacerbated by the existing LAD and circumflex disease.  Reduced EF but no active angina or heart failure symptoms after discharge.

## 2023-04-03 NOTE — Assessment & Plan Note (Signed)
Inferior STEMI with RCA PCI followed by staged PCI of the LAD and LCx.  Multiple stents, would likely need to be on uninterrupted DAPT for minimum a year but likely continue Thienopyridine monotherapy for lifetime.   Currently on Plavix and low-dose aspirin. Discussed the possibility of Plavix non-reactivity and potential switch to Effient if PTY12 inhibition assay shows Plavix is not effective. -Continue Plavix and low-dose aspirin. -Consider PTY12 inhibition assay to assess Plavix effectiveness.

## 2023-04-03 NOTE — Assessment & Plan Note (Signed)
In the past has been on multiple different statins although has not been on one recently.  Will need to reinitiate rosuvastatin to determine how tolerates.  Recheck lipids and refer to our lipid clinic for initiation of PCSK9 better versus inclisiran.  I think actually in his case inclisiran/likely will be a great option.

## 2023-04-03 NOTE — Assessment & Plan Note (Signed)
Admitted recently large inferior STEMI underwent RCA PCI followed by LAD and then LCx PCI.   No longer having any active anginal symptoms. Has been reluctant to take any medications and says he is feeling very well with no angina or heart failure. Currently only on low-dose Toprol 25 mg daily, 81 mg aspirin and clopidogrel. -Has declined statin in the past and continues to decline.  Also declined other medications and did not titrate medications during last visit.  -Continue Toprol 25 mg daily and add Diovan 40 mg daily for afterload reduction -Start low-dose Crestor to see if he tolerates and low threshold to consider PCSK9 inhibitor initiation

## 2023-04-03 NOTE — Assessment & Plan Note (Signed)
Despite having significantly reduced EF, NYHA class I symptoms.  Currently only on Toprol 25 mg daily. -Initiate Diovan 40 mg daily with plans to titrate up further and reassess EF.

## 2023-05-17 HISTORY — PX: TRANSTHORACIC ECHOCARDIOGRAM: SHX275

## 2023-05-26 ENCOUNTER — Ambulatory Visit (HOSPITAL_COMMUNITY): Payer: Medicare Other | Attending: Cardiology

## 2023-05-26 DIAGNOSIS — I2510 Atherosclerotic heart disease of native coronary artery without angina pectoris: Secondary | ICD-10-CM | POA: Insufficient documentation

## 2023-05-26 DIAGNOSIS — I255 Ischemic cardiomyopathy: Secondary | ICD-10-CM | POA: Insufficient documentation

## 2023-05-26 DIAGNOSIS — Z9861 Coronary angioplasty status: Secondary | ICD-10-CM | POA: Diagnosis not present

## 2023-05-26 LAB — ECHOCARDIOGRAM COMPLETE
MV M vel: 4.08 m/s
MV Peak grad: 66.7 mmHg
S' Lateral: 4.9 cm

## 2023-06-03 ENCOUNTER — Encounter: Payer: Self-pay | Admitting: Cardiology

## 2023-06-06 NOTE — Telephone Encounter (Signed)
 My original statement should have been corrected admit this a for consideration of ICD/defibrillator for reduced EF.  This is usually something we consider when people have pulm function is less than 35%.  It is generally considered to be protective against abnormal heart rhythms.  Bryan Lemma, MD

## 2023-06-18 LAB — LIPID PANEL
Chol/HDL Ratio: 7 ratio — ABNORMAL HIGH (ref 0.0–5.0)
Cholesterol, Total: 224 mg/dL — ABNORMAL HIGH (ref 100–199)
HDL: 32 mg/dL — ABNORMAL LOW (ref >39)
LDL Chol Calc (NIH): 171 mg/dL — ABNORMAL HIGH (ref 0–99)
Triglycerides: 115 mg/dL (ref 0–149)
VLDL Cholesterol Cal: 21 mg/dL (ref 5–40)

## 2023-06-19 ENCOUNTER — Encounter: Payer: Self-pay | Admitting: Cardiology

## 2023-06-23 ENCOUNTER — Encounter: Payer: Self-pay | Admitting: Cardiology

## 2023-06-23 ENCOUNTER — Ambulatory Visit: Payer: Medicare Other | Attending: Cardiology | Admitting: Cardiology

## 2023-06-23 VITALS — BP 140/80 | HR 88 | Ht 72.0 in | Wt 182.2 lb

## 2023-06-23 DIAGNOSIS — I255 Ischemic cardiomyopathy: Secondary | ICD-10-CM | POA: Diagnosis present

## 2023-06-23 DIAGNOSIS — R030 Elevated blood-pressure reading, without diagnosis of hypertension: Secondary | ICD-10-CM | POA: Diagnosis not present

## 2023-06-23 DIAGNOSIS — Z7189 Other specified counseling: Secondary | ICD-10-CM | POA: Diagnosis present

## 2023-06-23 DIAGNOSIS — Z9861 Coronary angioplasty status: Secondary | ICD-10-CM

## 2023-06-23 DIAGNOSIS — I2510 Atherosclerotic heart disease of native coronary artery without angina pectoris: Secondary | ICD-10-CM | POA: Diagnosis not present

## 2023-06-23 DIAGNOSIS — E785 Hyperlipidemia, unspecified: Secondary | ICD-10-CM | POA: Diagnosis present

## 2023-06-23 MED ORDER — PRASUGREL HCL 10 MG PO TABS: 10.0000 mg | ORAL_TABLET | Freq: Every day | ORAL | 3 refills | Status: AC

## 2023-06-23 NOTE — Patient Instructions (Addendum)
 Medication Instructions:   Start Effient ( Prasugrel) 10 mg daily   *If you need a refill on your cardiac medications before your next appointment, please call your pharmacy*   Lab Work: Not needed    Testing/Procedures: NOT NEEDED   Follow-Up: At [facility], you and your health needs are our priority.  As part of our continuing mission to provide you with exceptional heart care, we have created designated Provider Care Teams.  These Care Teams include your primary Cardiologist (physician) and Advanced Practice Providers (APPs -  Physician Assistants and Nurse Practitioners) who all work together to provide you with the care you need, when you need it.     Your next appointment:   6 month(s)  The format for your next appointment:   In Person  Provider:   Bryan Lemma, MD   You have been referred to  Lipid Clinic - [facility]-- to discuss restart Repatha or Praluent . Cholesterol levels increase.  Other Instructions  s

## 2023-06-23 NOTE — Progress Notes (Unsigned)
 Cardiology Office Note:  .   Date:  06/23/2023  ID:  Brandon Foster, DOB [birth year], MRN [id number] PCP: Patient, No Pcp Per  [facility] Providers Cardiologist:  Bryan Lemma, MD { Click to update primary MD,subspecialty MD or APP then REFRESH:1}    No chief complaint on file.   Patient Profile: .     Brandon Foster is a  68 y.o. male *** with a PMH notable for *** who presents here for *** at the request of No ref. provider found.  PMH notable for CAD (prior LAD PCI - with overlapping DES in ~2010) with recent Inferolateral STEMI (emergent PCI of the RCA followed by staged LAD and LCx PCI, jailing small OM1) with severe ICM-EF 25 to 30%.  Who presents here for 2 month follow-up as second posthospital visit since STEMI  Inferolateral STEMI 02/01/2023: LCx PCI (Synergy XD 2.75 mm x 24 mm - 3.1 mm) =-> briefly in cardiogenic shock Staged PCI mid LAD and proximal-mid LCx 02/03/2023:  SUCCESSFUL LAD PCI - covering 80%, 45% (ISR) & 70% distal to prior stent -- Overlapping Onyx Frontier DES 2.5 x  38 mm (tapered post-dilation 3.1 -> 2.6 mm) - all small Diagonal & Septal branches remained open.  SUCCESSFUL LCx-OM2 PCI - crossing OM1 & dist LCx using Onyx Frontier 3.0 x 18 mm -> deployed to 3.3 mm & post-dilated to 3.6 mm; OM1 jailed -> 100% occluded (too small for PTCA), distLCx post-stent 85%-> reduced to 30% with PTCA.  Echo 02/02/2023: EF 25 to 30%.  Mid anteroseptal basal inferolateral and basal anterolateral as well as mid inferoseptal and mid inferior and basal inferoseptal akinesis. Discharge with LifeVest Prior authorization for PCSK9 Inhibitor initiated     Brandon Foster was last seen on Jan 13th  Subjective  Discussed the use of AI scribe software for clinical note transcription with the patient, who gave verbal consent to proceed.  History of Present Illness  Cardiovascular ROS: {roscv:310661}  ROS:  Review of Systems - {ros master:310782}    Objective    Studies  Reviewed: [name]       ECHO 02/02/2023 (post MI): EF 25-30%: RWMA 06/10/2023   CATH  11/17 & 02/03/2023: 100% RCA -LAD & LCX severe disease --> Emergent RCA PCI & staged LAD / LCX PCI Diagnostic   Dominance: Right                                                        Intervention + Staged PCI    Risk Assessment/Calculations:     Not interested in medications      Physical Exam:   VS:  BP (!) 140/80   Pulse 88   Ht 6' (1.829 m)   Wt 182 lb 3.2 oz (82.6 kg)   SpO2 96%   BMI 24.71 kg/m    Wt Readings from Last 3 Encounters:  06/23/23 182 lb 3.2 oz (82.6 kg)  03/31/23 177 lb 9.6 oz (80.6 kg)  02/17/23 182 lb 6.4 oz (82.7 kg)    GEN: Well nourished, well developed in no acute distress; healthy appearing NECK: No JVD; No carotid bruits CARDIAC: Normal S1, S2; RRR, no murmurs, rubs, gallops RESPIRATORY:  Clear to auscultation without rales, wheezing or rhonchi ; nonlabored, good air movement. ABDOMEN: Soft, non-tender, non-distended EXTREMITIES:  No edema; No deformity  ASSESSMENT AND PLAN: .    Problem List Items Addressed This Visit   None   Assessment and Plan Assessment & Plan        {Are you ordering a CV Procedure (e.g. stress test, cath, DCCV, TEE, etc)?   Press F2        :[id number]}   Follow-Up: No follow-ups on file.  Total time spent: *** min spent with patient + *** min spent charting = *** min    Signed, Marykay Lex, MD, MS Bryan Lemma, M.D., M.S. Interventional Cardiologist  [facility]  Pager # [phone] Phone # [phone] [address] [place], [place] [address]

## 2023-06-25 ENCOUNTER — Encounter: Payer: Self-pay | Admitting: Cardiology

## 2023-06-25 NOTE — Progress Notes (Incomplete)
 Cardiology Office Note:  .   Date:  06/23/2023  ID:  Brandon Foster, DOB [birth year], MRN [id number] PCP: Patient, No Pcp Per  [facility] Providers Cardiologist:  Bryan Lemma, MD { Click to update primary MD,subspecialty MD or APP then REFRESH:1}    No chief complaint on file.   Patient Profile: .     Brandon Foster is a  68 y.o. male with a PMH notable for CAD had a PCI in the setting of inferolateral STEMI who presents here for 54-month follow-up at the request of No ref. provider found.  PMH notable for CAD (prior LAD PCI - with overlapping DES in ~2010) with recent Inferolateral STEMI (emergent PCI of the RCA followed by staged LAD and LCx PCI, jailing small OM1) with severe ICM-EF 25 to 30%.    Inferolateral STEMI 02/01/2023: RCA PCI (Synergy XD 2.75 mm x 24 mm - 3.1 mm) =-> briefly in cardiogenic shock Staged PCI mid LAD and proximal-mid LCx 02/03/2023:  SUCCESSFUL LAD PCI - covering 80%, 45% (ISR) & 70% distal to prior stent -- Overlapping Onyx Frontier DES 2.5 x  38 mm (tapered post-dilation 3.1 -> 2.6 mm) - all small Diagonal & Septal branches remained open.  SUCCESSFUL LCx-OM2 PCI - crossing OM1 & dist LCx using Onyx Frontier 3.0 x 18 mm -> deployed to 3.3 mm & post-dilated to 3.6 mm; OM1 jailed -> 100% occluded (too small for PTCA), distLCx post-stent 85%-> reduced to 30% with PTCA.  Ischemic Cardiomyopathy: Echo 02/02/2023: EF 25 to 30%.  Mid anteroseptal basal inferolateral and basal anterolateral as well as mid inferoseptal and mid inferior and basal inferoseptal akinesis. Discharge with LifeVest Prior authorization for PCSK9 Inhibitor initiated however patient declined     Brandon Foster was last seen on March 31, 2023 for his initial hospital follow-up post STEMI.  He was doing well started to feel stronger.  No real heart failure symptoms no angina symptoms.  He indicated a general "aversion to taking medications.  He did not really want to any medications.  Was not  interested PCSK9 Ambers, was not interested in keeping the LifeVest despite reduced EF-NYHA class I CHF with no angina.[name]  He indicated that he would not want ICD.  He indicates he just had adverse reactions to medications and does not like to take them. => 2D echo ordered along with Crestor 10 mg and Diovan 40.  Subjective  Discussed the use of AI scribe software for clinical note transcription with the patient, who gave verbal consent to proceed.  History of Present Illness History of Present Illness Brandon Foster is a 68 year old male with coronary artery disease and hyperlipidemia who presents for follow-up after a recent myocardial infarction.  He is four months post-myocardial infarction and reports increasing endurance and strength, feeling generally well.  He denies any ongoing issues with resting exertional chest pain or pressure.  No PND orthopnea or edema.  No rapid or irregular heartbeats palpitations.  No syncope/near syncope or TIA/amaurosis fugax.  No claudication.  He has hyperlipidemia with a very high LDL cholesterol level, genetically influenced. Previous statin therapy with Crestor was unsuccessful due to severe side effects, including muscle weakness. He is considering PCSK9 inhibitors like Repatha but is concerned about cost and potential adverse reactions.  He has experienced adverse reactions to multiple medications, including a rash, itching, and tingling sensations with Plavix. He is currently only taking aspirin. Brilinta was cost-prohibitive, => so because of concern for symptoms, he simply stopped his  Plavix without letting us know.  In clinic, he has not had any untoward effect.  He also stopped taking both Diovan and rosuvastatin.  He refuses taking a statin.  He experiences 'white coat syndrome' affecting blood pressure readings, typically 120s/80s at home but 140/80 during the visit. He is concerned about the impact of medications on mood, libido, and energy  levels.  He has a history of significant sun exposure leading to skin issues, treated with liquid nitrogen. His father had a history of skin cancer. He is actively managing his health through exercise, diet, and rest, feeling better each day.   Objective   Current meds include: PRN tadalafil and aspirin 81 mg daily. => No longer taking rosuvastatin 10 mg daily, Diovan 40 mg daily or Plavix 75 mg daily  Studies Reviewed: [name]       ECHO 02/02/2023 (post MI): EF 25-30%: RWMA: Mid anteroseptal, basal inferolateral, basal anterolateral, mid inferoseptal, mid inferior and basal inferoseptal akinesis.  Entire anterior wall as well as mid and distal lateral, basal anteroseptal mid anterolateral, apical septal, basal inferior and apical inferior segment are hypokinetic.  Consistent with multivessel disease.    CATH  11/17 & 02/03/2023: 100% RCA -LAD & LCX severe disease --> Emergent RCA PCI & staged LAD / LCX PCI Diagnostic   Dominance: Right                                                        Intervention + Staged PCI    Echocardiogram: EF 30-35%, globally down, mildly dilated, indeterminate diastolic filling pressures, severely dilated left atrium, mild mitral regurgitation, mild tricuspid regurgitation, mild aortic regurgitation (05/2023)  Lab Results  Component Value Date   CHOL 224 (H) 06/17/2023   HDL 32 (L) 06/17/2023   LDLCALC 171 (H) 06/17/2023   TRIG 115 06/17/2023   CHOLHDL 7.0 (H) 06/17/2023   Lab Results  Component Value Date   NA 138 02/04/2023   K 3.7 02/04/2023   CREATININE 1.06 02/04/2023   GFRNONAA >60 02/04/2023   GLUCOSE 92 02/04/2023  '  Risk Assessment/Calculations:     Not interested in medications      Physical Exam:   VS:  BP (!) 140/80   Pulse 88   Ht 6' (1.829 m)   Wt 182 lb 3.2 oz (82.6 kg)   SpO2 96%   BMI 24.71 kg/m    Wt Readings from Last 3 Encounters:  06/23/23 182 lb 3.2 oz (82.6 kg)  03/31/23 177 lb 9.6 oz (80.6 kg)  02/17/23 182 lb 6.4  oz (82.7 kg)    GEN: Well nourished, well developed in no acute distress; healthy appearing NECK: No JVD; No carotid bruits CARDIAC: Normal S1, S2; RRR, no murmurs, rubs, gallops RESPIRATORY:  Clear to auscultation without rales, wheezing or rhonchi ; nonlabored, good air movement. ABDOMEN: Soft, non-tender, non-distended EXTREMITIES:  No edema; No deformity      ASSESSMENT AND PLAN: .    Problem List Items Addressed This Visit   None   Assessment and Plan Assessment & Plan Assessment and Plan Post-myocardial infarction with reduced ejection fraction EF improved to 30-35% but remains reduced. Heart mildly dilated with indeterminate diastolic filling pressures. Severely dilated left atrium. Mild regurgitation in mitral, tricuspid, and aortic valves. At risk for atrial fibrillation. Declined ICD. - Start  Effient (prasugrel) as an antiplatelet therapy. - Refer to lipid clinic for hyperlipidemia management. - Schedule follow-up in six months.  Hyperlipidemia LDL remains high. Statins not tolerated. Interested in PCSK9 inhibitors but cost is a barrier. Explained PCSK9 inhibitors and financial assistance options. - Refer to lipid clinic for PCSK9 inhibitors and financial assistance programs.  Hypertension Blood pressure well-controlled at home. Elevated during visit likely due to white coat syndrome. Not taking valsartan due to side effects. - Consider re-evaluation of antihypertensive therapy if needed.    Recording duration: 21 minutes        Goals of Care Prefers quality of life over aggressive interventions. Avoids ICD and medications affecting libido and energy. Long discussion about the importance of treating his risk factors, but he indicates that he understands the risk of not taking medications but refuses to take medications.   Follow-Up: No follow-ups on file.  Total time spent: *** min spent with patient + *** min spent charting = *** min    Signed, Marykay Lex, MD, MS Bryan Lemma, M.D., M.S. Interventional Cardiologist  [facility]  Pager # [phone] Phone # [phone] [address] [place], [place] [address]

## 2023-06-26 ENCOUNTER — Encounter: Payer: Self-pay | Admitting: Cardiology

## 2023-06-26 DIAGNOSIS — Z7189 Other specified counseling: Secondary | ICD-10-CM | POA: Insufficient documentation

## 2023-06-26 NOTE — Assessment & Plan Note (Signed)
 Post-myocardial infarction with reduced ejection fraction EF improved to 30-35% but remains reduced. Heart mildly dilated with indeterminate diastolic filling pressures. Severely dilated left atrium. Mild regurgitation in mitral, tricuspid, and aortic valves. At risk for atrial fibrillation. Declined ICD.  Thankfully, NYHA class I symptoms, and euvolemic on exam.  He did not tolerate Diovan, previously did not tolerate beta-blocker. No valvular options. Discussed indications for treating with medications despite not having symptoms, but he indicated that he did not want any medications.

## 2023-06-26 NOTE — Assessment & Plan Note (Signed)
 Lipid results reviewed.  LDL 171. Refuses statins. I did talk to him about injection medication such as Repatha, Praluent and/or Leqvio.  He reluctantly agreed to referral to [facility] clinical pharmacist team to discuss options.  - Refer to [facility] for PCSK9 inhibitor/Leqvio consult and financial assistance programs.

## 2023-06-26 NOTE — Assessment & Plan Note (Signed)
 Status post inferior MI with RCA PCI as well as multiple stents to the LAD and as well as LCx. Unfortunately, despite his lack of desire to take medications, worse.  That he now has stents and needs to be on Thienopyridine.  I scolded him for stopping Plavix and not letting us know.  I explained to him that that could have caused him quite a bit including his life. We talked about doing P2Y12 inhibition assay with Plavix, but now he refuses Plavix, and previously would not tolerate Brilinta..  - We are only left with Effient.  Will start Effient 10 mg daily and admonished him that he needs to take this medicine. For future reference, if he refuses this medication he will need to find a cardiologist.  Plan will be long-term Thienopyridine (at a minimum 2 years based on the extent of stents), but can stop aspirin after 1 year.

## 2023-06-26 NOTE — Assessment & Plan Note (Signed)
 Status post three-vessel PCI for severe multivessel disease including acute revascularization of occluded RCA, PCI overlapping old LAD stents and Denovo lesion in the LCx. Unfortunately, unable to treat with GDMT because of his reluctance to take medication.  He refused statin after taking couple days he also refused ARB.  He even stopped Plavix.  -Start Effient 10 mg daily and continue aspirin 81 mg daily - Because of his reluctance we will not use ARB or beta-blocker or statin (see informed decision-making discussion)

## 2023-06-26 NOTE — Assessment & Plan Note (Signed)
 Blood pressure well-controlled at home. Elevated during visit likely due to white coat syndrome. Not taking valsartan due to side effects. - Consider re-evaluation of antihypertensive therapy if needed; however, he stopped taking low-dose Diovan stating that he just felt horrible with multiple known side effects indicating that he probably will not take medications.

## 2023-06-26 NOTE — Assessment & Plan Note (Signed)
 Prefers quality of life over aggressive interventions.  Avoids ICD and medications affecting libido and energy.  Informed decision-making. Long discussion about the importance of treating his risk factors, but he indicates that he understands the risk of not taking medications but refuses to take medications.

## 2023-06-27 ENCOUNTER — Ambulatory Visit: Attending: Cardiology | Admitting: Pharmacist Clinician (PhC)/ Clinical Pharmacy Specialist

## 2023-06-27 ENCOUNTER — Encounter: Payer: Self-pay | Admitting: Pharmacist Clinician (PhC)/ Clinical Pharmacy Specialist

## 2023-06-27 DIAGNOSIS — E785 Hyperlipidemia, unspecified: Secondary | ICD-10-CM | POA: Insufficient documentation

## 2023-06-27 NOTE — Patient Instructions (Signed)
 Your Results:             Your most recent labs Goal  Total Cholesterol 224 < 200  Triglycerides 115 < 150  HDL (happy/good cholesterol) 32 > 40  LDL (lousy/bad cholesterol 171 < 70   Medication changes:  We will start the process to get Repatha covered by the manufacturer.   If they approve,  you will take one injection every 14 days  Lab orders:  We want to repeat labs after 2-3 months.  We will send you a lab order to remind you once we get closer to that time.     Thank you for choosing [facility]

## 2023-06-27 NOTE — Progress Notes (Signed)
 Office Visit    Patient Name: Brandon Foster Date of Encounter: 06/27/2023  Primary Care Provider:  Patient, No Pcp Per Primary Cardiologist:  Bryan Lemma, MD  Chief Complaint    Hyperlipidemia   Significant Past Medical History   CAD 11/24 cath - mLAD in-stent restenosis; 50% pLAD and 70% dLAD stenosis, DES to cover all 3 areas; DES to pCX and 2nd Mrg;   HFrER EF 30-35% by echo 3/25              Allergies  Allergen Reactions   Simvastatin Other (See Comments)    Myalgias / myopathy   Banana     Causes cramping    History of Present Illness    Brandon Foster is a 68 y.o. male patient of Dr Herbie Baltimore, in the office today to discuss options for cholesterol management.  Started Effient about 3-4 days ago, so far no issues.  Rash/itching with clopidogrel.  Brilinta was cost prohibitive.    Insurance Carrier:  no PDP (understands that he will need to apply this fall)  LDL Cholesterol goal:  LDL < 70  Current Medications:   none  Previously tried:  simvastatin, rosuvastatin - myalgias  Family Hx:   sister with multiple stents also, mother died in her early 17's; son notes lipids are well controlled  Social Hx: Tobacco: no Alcohol: 1-2 beer per week    Diet:    has improved his eating habits, trying to lower cholesterol naturally  Exercise: resistance training twice weekly, cardio couple of times per week (hikes ~4 miles)   Accessory Clinical Findings   Lab Results  Component Value Date   CHOL 224 (H) 06/17/2023   HDL 32 (L) 06/17/2023   LDLCALC 171 (H) 06/17/2023   TRIG 115 06/17/2023   CHOLHDL 7.0 (H) 06/17/2023    Lipoprotein (a)  Date/Time Value Ref Range Status  02/02/2023 03:05 AM 34.7 (H) <75.0 nmol/L Final    Comment:    (NOTE) Note:  Values greater than or equal to 75.0 nmol/L may       indicate an independent risk factor for CHD,       but must be evaluated with caution when applied       to non-Caucasian populations due to the       influence of  genetic factors on Lp(a) across       ethnicities. Performed At: [facility] [address] [place], [place] [id number] Jolene Schimke MD ZD:[id number]     Lab Results  Component Value Date   ALT 33 02/01/2023   AST 26 02/01/2023   ALKPHOS 47 02/01/2023   BILITOT 0.7 02/01/2023   Lab Results  Component Value Date   CREATININE 1.06 02/04/2023   BUN 14 02/04/2023   NA 138 02/04/2023   K 3.7 02/04/2023   CL 110 02/04/2023   CO2 21 (L) 02/04/2023   Lab Results  Component Value Date   HGBA1C 5.4 02/02/2023    Home Medications    Current Outpatient Medications  Medication Sig Dispense Refill   Ascorbic Acid (VITAMIN C PO) Take 1 tablet by mouth daily.     aspirin 81 MG chewable tablet Chew 1 tablet (81 mg total) by mouth daily. 90 tablet 2   b complex vitamins capsule Take 1 capsule by mouth daily.     prasugrel (EFFIENT) 10 MG TABS tablet Take 1 tablet (10 mg total) by mouth daily. 90 tablet 3   tadalafil (CIALIS) 5 MG tablet Take 5  mg by mouth daily.     testosterone cypionate (DEPOTESTOTERONE CYPIONATE) 100 MG/ML injection Inject 50 mg into the muscle See admin instructions. Inject 50mg  into the muscle twice a week.     VITAMIN D PO Take 1 tablet by mouth daily.     VITAMIN E PO Take 1 capsule by mouth daily.     No current facility-administered medications for this visit.     Assessment & Plan    Hyperlipidemia with target low density lipoprotein (LDL) cholesterol less than 55 mg/dL Assessment: Patient with ASCVD not at LDL goal of < 70 Most recent LDL 171 on 06/17/23 Not able to tolerate statins secondary to myalgias - rosuvastatin, simvastatin Reviewed options for lowering LDL cholesterol, including ezetimibe, PCSK-9 inhibitors and inclisiran.  Discussed mechanisms of action, dosing, side effects, potential decreases in LDL cholesterol and costs.  Also reviewed potential options for patient assistance.  Plan: Patient agreeable to starting either Repatha or  Leqvio Patient uninsured, so will need to determine costs for each and whether any patient assistance is available.  (Wife currently working, so may not be able to get assistance) Repeat labs after:  3-4 months Lipid Liver function    Phillips Hay, PharmD CPP [facility] [address]  [place], [place] [address] [phone]  06/27/2023, 10:21 AM

## 2023-06-27 NOTE — Assessment & Plan Note (Signed)
 Assessment: Patient with ASCVD not at LDL goal of < 70 Most recent LDL 171 on 06/17/23 Not able to tolerate statins secondary to myalgias - rosuvastatin, simvastatin Reviewed options for lowering LDL cholesterol, including ezetimibe, PCSK-9 inhibitors and inclisiran.  Discussed mechanisms of action, dosing, side effects, potential decreases in LDL cholesterol and costs.  Also reviewed potential options for patient assistance.  Plan: Patient agreeable to starting either Repatha or Leqvio Patient uninsured, so will need to determine costs for each and whether any patient assistance is available.  (Wife currently working, so may not be able to get assistance) Repeat labs after:  3-4 months Lipid Liver function

## 2023-07-07 ENCOUNTER — Encounter: Payer: Self-pay | Admitting: Pharmacist Clinician (PhC)/ Clinical Pharmacy Specialist

## 2024-04-01 ENCOUNTER — Encounter (HOSPITAL_COMMUNITY): Payer: Self-pay

## 2024-04-01 ENCOUNTER — Other Ambulatory Visit: Payer: Self-pay

## 2024-04-01 ENCOUNTER — Observation Stay (HOSPITAL_COMMUNITY)
Admission: EM | Admit: 2024-04-01 | Discharge: 2024-04-02 | Disposition: A | Discharge status: Home / Self Care | Attending: Internal Medicine | Admitting: Internal Medicine

## 2024-04-01 ENCOUNTER — Emergency Department (HOSPITAL_COMMUNITY)

## 2024-04-01 ENCOUNTER — Observation Stay (HOSPITAL_COMMUNITY)

## 2024-04-01 DIAGNOSIS — I5023 Acute on chronic systolic (congestive) heart failure: Secondary | ICD-10-CM | POA: Diagnosis not present

## 2024-04-01 DIAGNOSIS — Z7982 Long term (current) use of aspirin: Secondary | ICD-10-CM | POA: Insufficient documentation

## 2024-04-01 DIAGNOSIS — I493 Ventricular premature depolarization: Secondary | ICD-10-CM | POA: Diagnosis not present

## 2024-04-01 DIAGNOSIS — I2119 ST elevation (STEMI) myocardial infarction involving other coronary artery of inferior wall: Secondary | ICD-10-CM | POA: Diagnosis present

## 2024-04-01 DIAGNOSIS — I445 Left posterior fascicular block: Secondary | ICD-10-CM | POA: Insufficient documentation

## 2024-04-01 DIAGNOSIS — Z87891 Personal history of nicotine dependence: Secondary | ICD-10-CM | POA: Insufficient documentation

## 2024-04-01 DIAGNOSIS — N179 Acute kidney failure, unspecified: Secondary | ICD-10-CM | POA: Diagnosis not present

## 2024-04-01 DIAGNOSIS — I10 Essential (primary) hypertension: Secondary | ICD-10-CM

## 2024-04-01 DIAGNOSIS — I255 Ischemic cardiomyopathy: Secondary | ICD-10-CM | POA: Insufficient documentation

## 2024-04-01 DIAGNOSIS — I252 Old myocardial infarction: Secondary | ICD-10-CM | POA: Insufficient documentation

## 2024-04-01 DIAGNOSIS — M6281 Muscle weakness (generalized): Secondary | ICD-10-CM | POA: Diagnosis not present

## 2024-04-01 DIAGNOSIS — G720 Drug-induced myopathy: Secondary | ICD-10-CM

## 2024-04-01 DIAGNOSIS — D751 Secondary polycythemia: Secondary | ICD-10-CM | POA: Insufficient documentation

## 2024-04-01 DIAGNOSIS — I110 Hypertensive heart disease with heart failure: Secondary | ICD-10-CM | POA: Diagnosis not present

## 2024-04-01 DIAGNOSIS — E785 Hyperlipidemia, unspecified: Secondary | ICD-10-CM | POA: Insufficient documentation

## 2024-04-01 DIAGNOSIS — I2510 Atherosclerotic heart disease of native coronary artery without angina pectoris: Secondary | ICD-10-CM | POA: Insufficient documentation

## 2024-04-01 DIAGNOSIS — I080 Rheumatic disorders of both mitral and aortic valves: Secondary | ICD-10-CM | POA: Diagnosis not present

## 2024-04-01 DIAGNOSIS — Z955 Presence of coronary angioplasty implant and graft: Secondary | ICD-10-CM | POA: Insufficient documentation

## 2024-04-01 DIAGNOSIS — M545 Low back pain, unspecified: Secondary | ICD-10-CM | POA: Diagnosis present

## 2024-04-01 DIAGNOSIS — I509 Heart failure, unspecified: Principal | ICD-10-CM

## 2024-04-01 LAB — CBC WITH DIFFERENTIAL/PLATELET
Abs Immature Granulocytes: 0.03 K/uL (ref 0.00–0.07)
Basophils Absolute: 0 K/uL (ref 0.0–0.1)
Basophils Relative: 1 %
Eosinophils Absolute: 0.1 K/uL (ref 0.0–0.5)
Eosinophils Relative: 2 %
HCT: 54.2 % — ABNORMAL HIGH (ref 39.0–52.0)
Hemoglobin: 18.2 g/dL — ABNORMAL HIGH (ref 13.0–17.0)
Immature Granulocytes: 0 %
Lymphocytes Relative: 14 %
Lymphs Abs: 1.2 K/uL (ref 0.7–4.0)
MCH: 29.3 pg (ref 26.0–34.0)
MCHC: 33.6 g/dL (ref 30.0–36.0)
MCV: 87.3 fL (ref 80.0–100.0)
Monocytes Absolute: 0.9 K/uL (ref 0.1–1.0)
Monocytes Relative: 11 %
Neutro Abs: 6.3 K/uL (ref 1.7–7.7)
Neutrophils Relative %: 72 %
Platelets: 214 K/uL (ref 150–400)
RBC: 6.21 MIL/uL — ABNORMAL HIGH (ref 4.22–5.81)
RDW: 16.7 % — ABNORMAL HIGH (ref 11.5–15.5)
WBC: 8.6 K/uL (ref 4.0–10.5)
nRBC: 0 % (ref 0.0–0.2)

## 2024-04-01 LAB — BASIC METABOLIC PANEL WITH GFR
Anion gap: 10 (ref 5–15)
BUN: 23 mg/dL (ref 8–23)
CO2: 25 mmol/L (ref 22–32)
Calcium: 8.9 mg/dL (ref 8.9–10.3)
Chloride: 103 mmol/L (ref 98–111)
Creatinine, Ser: 1.52 mg/dL — ABNORMAL HIGH (ref 0.61–1.24)
GFR, Estimated: 50 mL/min — ABNORMAL LOW
Glucose, Bld: 113 mg/dL — ABNORMAL HIGH (ref 70–99)
Potassium: 4.8 mmol/L (ref 3.5–5.1)
Sodium: 138 mmol/L (ref 135–145)

## 2024-04-01 LAB — URINALYSIS, W/ REFLEX TO CULTURE (INFECTION SUSPECTED)
Bacteria, UA: NONE SEEN
Bilirubin Urine: NEGATIVE
Glucose, UA: NEGATIVE mg/dL
Hgb urine dipstick: NEGATIVE
Ketones, ur: 5 mg/dL — AB
Leukocytes,Ua: NEGATIVE
Nitrite: NEGATIVE
Protein, ur: 30 mg/dL — AB
Specific Gravity, Urine: 1.011 (ref 1.005–1.030)
pH: 5 (ref 5.0–8.0)

## 2024-04-01 LAB — HEPATIC FUNCTION PANEL
ALT: 89 U/L — ABNORMAL HIGH (ref 0–44)
AST: 67 U/L — ABNORMAL HIGH (ref 15–41)
Albumin: 4 g/dL (ref 3.5–5.0)
Alkaline Phosphatase: 53 U/L (ref 38–126)
Bilirubin, Direct: 0.4 mg/dL — ABNORMAL HIGH (ref 0.0–0.2)
Indirect Bilirubin: 0.6 mg/dL (ref 0.3–0.9)
Total Bilirubin: 1 mg/dL (ref 0.0–1.2)
Total Protein: 6.5 g/dL (ref 6.5–8.1)

## 2024-04-01 LAB — PRO BRAIN NATRIURETIC PEPTIDE: Pro Brain Natriuretic Peptide: 3181 pg/mL — ABNORMAL HIGH

## 2024-04-01 LAB — I-STAT CG4 LACTIC ACID, ED: Lactic Acid, Venous: 1.4 mmol/L (ref 0.5–1.9)

## 2024-04-01 LAB — ECHOCARDIOGRAM COMPLETE
AR max vel: 1.95 cm2
AV Area VTI: 2.7 cm2
AV Area mean vel: 2.02 cm2
AV Mean grad: 4 mmHg
AV Peak grad: 6 mmHg
Ao pk vel: 1.22 m/s
Area-P 1/2: 4.29 cm2
Height: 72 in
S' Lateral: 6 cm
Weight: 2944 [oz_av]

## 2024-04-01 LAB — MAGNESIUM: Magnesium: 2.2 mg/dL (ref 1.7–2.4)

## 2024-04-01 LAB — LIPID PANEL
Cholesterol: 190 mg/dL (ref 0–200)
HDL: 29 mg/dL — ABNORMAL LOW
LDL Cholesterol: 133 mg/dL — ABNORMAL HIGH (ref 0–99)
Total CHOL/HDL Ratio: 6.6 ratio
Triglycerides: 138 mg/dL
VLDL: 28 mg/dL (ref 0–40)

## 2024-04-01 LAB — HEMOGLOBIN A1C
Hgb A1c MFr Bld: 5.5 % (ref 4.8–5.6)
Mean Plasma Glucose: 111.15 mg/dL

## 2024-04-01 LAB — FERRITIN: Ferritin: 53 ng/mL (ref 24–336)

## 2024-04-01 LAB — HIV ANTIBODY (ROUTINE TESTING W REFLEX): HIV Screen 4th Generation wRfx: NONREACTIVE

## 2024-04-01 LAB — IRON AND TIBC
Iron: 69 ug/dL (ref 45–182)
Saturation Ratios: 19 % (ref 17.9–39.5)
TIBC: 370 ug/dL (ref 250–450)
UIBC: 301 ug/dL

## 2024-04-01 LAB — TROPONIN T, HIGH SENSITIVITY
Troponin T High Sensitivity: 26 ng/L — ABNORMAL HIGH (ref 0–19)
Troponin T High Sensitivity: 26 ng/L — ABNORMAL HIGH (ref 0–19)

## 2024-04-01 MED ORDER — CLOPIDOGREL BISULFATE 75 MG PO TABS
75.0000 mg | ORAL_TABLET | Freq: Every day | ORAL | Status: DC
  Administered 2024-04-02: 75 mg via ORAL
  Filled 2024-04-01: qty 1

## 2024-04-01 MED ORDER — FUROSEMIDE 10 MG/ML IJ SOLN
40.0000 mg | Freq: Once | INTRAMUSCULAR | Status: AC
  Administered 2024-04-01: 40 mg via INTRAVENOUS
  Filled 2024-04-01: qty 4

## 2024-04-01 MED ORDER — PERFLUTREN LIPID MICROSPHERE
1.0000 mL | INTRAVENOUS | Status: AC | PRN
  Administered 2024-04-01: 2 mL via INTRAVENOUS

## 2024-04-01 MED ORDER — FUROSEMIDE 10 MG/ML IJ SOLN
40.0000 mg | Freq: Four times a day (QID) | INTRAMUSCULAR | Status: AC
  Administered 2024-04-01 – 2024-04-02 (×3): 40 mg via INTRAVENOUS
  Filled 2024-04-01 (×3): qty 4

## 2024-04-01 MED ORDER — ROSUVASTATIN CALCIUM 20 MG PO TABS: 20.0000 mg | ORAL_TABLET | Freq: Every day | ORAL | Status: DC

## 2024-04-01 MED ORDER — SPIRONOLACTONE 12.5 MG HALF TABLET
12.5000 mg | ORAL_TABLET | Freq: Every day | ORAL | Status: DC
  Administered 2024-04-01 – 2024-04-02 (×2): 12.5 mg via ORAL
  Filled 2024-04-01 (×2): qty 1

## 2024-04-01 MED ORDER — IRBESARTAN 75 MG PO TABS
75.0000 mg | ORAL_TABLET | Freq: Every day | ORAL | Status: DC
  Administered 2024-04-01 – 2024-04-02 (×2): 75 mg via ORAL
  Filled 2024-04-01 (×2): qty 1

## 2024-04-01 MED ORDER — ASPIRIN 81 MG PO TBEC
81.0000 mg | DELAYED_RELEASE_TABLET | Freq: Every day | ORAL | Status: DC
  Administered 2024-04-02: 81 mg via ORAL
  Filled 2024-04-01: qty 1

## 2024-04-01 MED ORDER — ENOXAPARIN SODIUM 40 MG/0.4ML IJ SOSY
40.0000 mg | PREFILLED_SYRINGE | Freq: Every day | INTRAMUSCULAR | Status: DC
  Administered 2024-04-01 – 2024-04-02 (×2): 40 mg via SUBCUTANEOUS
  Filled 2024-04-01 (×2): qty 0.4

## 2024-04-01 NOTE — ED Notes (Signed)
 Patient ambulated to bathroom independently for urine specimen collection

## 2024-04-01 NOTE — ED Provider Notes (Signed)
 " [facility] Provider Note   CSN: [id number] Arrival date & time: 04/01/24  9152     Patient presents with: Back Pain   Brandon Foster is a 69 y.o. male.   69 year old male presents with mid upper back pain between shoulder blades for the past 2 to 3 weeks.  States that the pain comes and goes.  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  Denies any fever but has had some congestion.  No severe dyspnea exertion.  Pain does not associate with certain movements.  Does not have any urinary symptoms.  No radicular symptoms.  Has been using hydrocodone which does take away his symptoms.  Denies any leg pain or swelling       Prior to Admission medications  Medication Sig Start Date End Date Taking? Authorizing Provider  Ascorbic Acid (VITAMIN C PO) Take 1 tablet by mouth daily.    [provider]  aspirin  81 MG chewable tablet Chew 1 tablet (81 mg total) by mouth daily. 02/05/23   Henry Shaver B, NP  b complex vitamins capsule Take 1 capsule by mouth daily.    [provider]  tadalafil  (CIALIS ) 5 MG tablet Take 5 mg by mouth daily.    [provider]  testosterone  cypionate (DEPOTESTOTERONE CYPIONATE) 100 MG/ML injection Inject 50 mg into the muscle See admin instructions. Inject 50mg  into the muscle twice a week.    [provider]  VITAMIN D PO Take 1 tablet by mouth daily.    [provider]  VITAMIN E PO Take 1 capsule by mouth daily.    [provider]    Allergies: Simvastatin and Banana    Review of Systems  All other systems reviewed and are negative.   Updated Vital Signs BP (!) 154/103 (BP Location: Right Arm)   Pulse (!) 108   Temp 98 F (36.7 C)   Resp 16   Ht 1.829 m (6')   Wt 83.5 kg   SpO2 95%   BMI 24.95 kg/m   Physical Exam Vitals and nursing note reviewed.  Constitutional:      General: He is not in acute distress.     Appearance: Normal appearance. He is well-developed. He is not toxic-appearing.  HENT:     Head: Normocephalic and atraumatic.  Eyes:     General: Lids are normal.     Conjunctiva/sclera: Conjunctivae normal.     Pupils: Pupils are equal, round, and reactive to light.  Neck:     Thyroid: No thyroid mass.     Trachea: No tracheal deviation.  Cardiovascular:     Rate and Rhythm: Normal rate and regular rhythm.     Heart sounds: Normal heart sounds. No murmur heard.    No gallop.  Pulmonary:     Effort: Pulmonary effort is normal. No respiratory distress.     Breath sounds: Normal breath sounds. No stridor. No decreased breath sounds, wheezing, rhonchi or rales.  Abdominal:     General: There is no distension.     Palpations: Abdomen is soft.     Tenderness: There is no abdominal tenderness. There is no rebound.  Musculoskeletal:        General: No tenderness. Normal range of motion.     Cervical back: Normal range of motion and neck supple.  Skin:    General: Skin is warm and dry.     Findings:  No abrasion or rash.  Neurological:     Mental Status: He is alert and oriented to person, place, and time. Mental status is at baseline.     GCS: GCS eye subscore is 4. GCS verbal subscore is 5. GCS motor subscore is 6.     Cranial Nerves: No cranial nerve deficit.     Sensory: No sensory deficit.     Motor: Motor function is intact.  Psychiatric:        Attention and Perception: Attention normal.        Speech: Speech normal.        Behavior: Behavior normal.     (all labs ordered are listed, but only abnormal results are displayed) Labs Reviewed  CBC WITH DIFFERENTIAL/PLATELET  BASIC METABOLIC PANEL WITH GFR  URINALYSIS, W/ REFLEX TO CULTURE (INFECTION SUSPECTED)  TROPONIN T, HIGH SENSITIVITY    EKG: EKG Interpretation Date/Time:  Thursday April 01 2024 09:14:52 EST Ventricular Rate:  105 PR Interval:  152 QRS Duration:  92 QT Interval:  332 QTC Calculation: 438 R  Axis:   122  Text Interpretation: Sinus tachycardia with occasional Premature ventricular complexes Left posterior fascicular block Nonspecific T wave abnormality Abnormal ECG When compared with ECG of 31-Mar-2023 14:03, PREVIOUS ECG IS PRESENT Confirmed by Dasie Faden ([id number]) on 04/01/2024 12:11:35 PM  Radiology: No results found.   Procedures   Medications Ordered in the ED - No data to display                                  Medical Decision Making Amount and/or Complexity of Data Reviewed Labs: ordered. Radiology: ordered. ECG/medicine tests: ordered.   Patient's EKG shows sinus tachycardia.  Chest x-ray concerning for heart failure and BNP is elevated.  Troponins x 2 were flat.  Does have an elevation of his creatinine.  May have some CKD versus dehydration.  Patient does have a history of a low EF in the past.  Will be given Lasix  40 mg IV push.  Patient will require admission and will consult hospitalist team  CRITICAL CARE Performed by: Faden ONEIDA Dasie Total critical care time: 45 minutes Critical care time was exclusive of separately billable procedures and treating other patients. Critical care was necessary to treat or prevent imminent or life-threatening deterioration. Critical care was time spent personally by me on the following activities: development of treatment plan with patient and/or surrogate as well as nursing, discussions with consultants, evaluation of patient's response to treatment, examination of patient, obtaining history from patient or surrogate, ordering and performing treatments and interventions, ordering and review of laboratory studies, ordering and review of radiographic studies, pulse oximetry and re-evaluation of patient's condition.      Final diagnoses:  None    ED Discharge Orders     None          Dasie Faden, MD 04/01/24 1212  "

## 2024-04-01 NOTE — ED Triage Notes (Signed)
 Pt here for middle/upper back pain in between shoulder blades. C/O SHOB/fatigue/intermittent loss of appetite/headache/congestion for 2 weeks. Axox4.
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
## 2024-04-01 NOTE — Consult Note (Addendum)
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 Lesion Assessment The intervention was successful. Intentional subintimal strategy was used. Pre-interventional TIMI flow is 0. Post-intervention TIMI flow is 3. Treated lesion length:  24 mm. At this lesion, a distal embolization occurred. Distalization down the RPL 3; with with PTCA, the patient vague old and had a significant drop in blood pressure and heart rate requiring 1 whole milligram of phenylephrine  and 240 mcg aliquots followed by Levophed  infusion. To treat the distal embolization, I used the 2.5 mm balloon and extended by wire beyond the distal occlusion site and to use the Dotter technique to manipulate thrombus.  Did not inflate the balloon but was able to push the thrombus down to the very distal tip of the vessel.  Plan is to run 3 hours of Aggrastat , preceded by bolus There is a 0% residual stenosis post intervention.     ECHOCARDIOGRAM  ECHOCARDIOGRAM COMPLETE 04/01/2024  Narrative ECHOCARDIOGRAM REPORT    Patient Name:   Brandon Foster Date of Exam: 04/01/2024 Medical Rec #:  [id number]   Height:       72.0 in Accession #:    [id number]  Weight:       184.0 lb Date of Birth:  November 16, 1955   BSA:          2.056 m Patient Age:    68 years    BP:           134/100 mmHg Patient Gender: M           HR:           107 bpm. Exam Location:  Inpatient  Procedure: 2D Echo, Cardiac Doppler, Color Doppler and Intracardiac Opacification Agent (Both Spectral and Color Flow Doppler were utilized during procedure).  Indications:    Congestive Heart Failure I50.9  History:        Patient has prior history of Echocardiogram examinations, most recent 05/26/2023.  Sonographer:    Jayson Gaskins Referring Phys: [phone] ANTHONY ALLEN  IMPRESSIONS   1. Left ventricular ejection fraction, by estimation, is 25 to 30%. The left ventricle has severely decreased function. The left ventricle demonstrates global hypokinesis. The left ventricular internal cavity size was moderately dilated. Left ventricular diastolic parameters are consistent with Grade III diastolic dysfunction (restrictive). 2. Right ventricular systolic function is normal. The right ventricular size is moderately enlarged. 3. Left atrial size was severely dilated. 4. Right atrial size was severely dilated. 5. The mitral valve is normal in structure. Mild mitral valve regurgitation. No evidence of mitral stenosis. 6. The aortic valve is tricuspid. Aortic valve regurgitation is trivial. Aortic valve sclerosis is present, with no evidence of aortic valve stenosis. 7. The inferior vena cava is dilated in size with >50% respiratory variability, suggesting right atrial pressure of 8 mmHg.  FINDINGS Left Ventricle: Left ventricular ejection fraction, by estimation, is 25 to 30%. The left ventricle has severely decreased function. The left ventricle demonstrates global hypokinesis. The left ventricular internal cavity size was moderately dilated. There is no left ventricular hypertrophy.  Left ventricular diastolic parameters are consistent with Grade III diastolic dysfunction (restrictive).  Right Ventricle: The right ventricular size is moderately enlarged. Right ventricular systolic function is normal.  Left Atrium: Left atrial size was severely dilated.  Right Atrium: Right atrial size was severely dilated.  Pericardium: There is no evidence of pericardial effusion.  Mitral Valve: The mitral valve is normal in structure. Mild mitral valve regurgitation. No evidence of mitral valve stenosis.  Tricuspid Valve: The tricuspid valve is normal  in structure. Tricuspid valve regurgitation is trivial. No evidence of tricuspid stenosis.  Aortic Valve: The aortic valve is tricuspid. Aortic valve regurgitation is trivial. Aortic valve sclerosis is present, with no evidence of aortic valve stenosis. Aortic valve mean gradient measures 4.0 mmHg. Aortic valve peak gradient measures 6.0 mmHg. Aortic valve area, by VTI measures 2.70 cm.  Pulmonic Valve: The pulmonic valve was normal in structure. Pulmonic valve regurgitation is not visualized. No evidence of pulmonic stenosis.  Aorta: The aortic root is normal in size and structure.  Venous: The inferior vena cava is dilated in size with greater than 50% respiratory variability, suggesting right atrial pressure of 8 mmHg.  IAS/Shunts: No atrial level shunt detected by color flow Doppler.   LEFT VENTRICLE PLAX 2D LVIDd:         6.20 cm   Diastology LVIDs:         6.00 cm   LV e' medial:   5.33 cm/s LV PW:         1.00 cm   LV E/e' medial: 21.6 LV IVS:        0.60 cm LVOT diam:     2.00 cm LV SV:         44 LV SV Index:   21 LVOT Area:     3.14 cm   RIGHT VENTRICLE RV S prime:     13.70 cm/s TAPSE (M-mode): 2.0 cm  LEFT ATRIUM             Index        RIGHT ATRIUM           Index LA Vol (A2C):   54.8 ml 26.65 ml/m  RA Area:     26.70 cm LA Vol (A4C):   82.7 ml 40.22 ml/m  RA Volume:   97.80 ml  47.56 ml/m LA  Biplane Vol: 68.4 ml 33.26 ml/m AORTIC VALVE AV Area (Vmax):    1.95 cm AV Area (Vmean):   2.02 cm AV Area (VTI):     2.70 cm AV Vmax:           122.00 cm/s AV Vmean:          91.800 cm/s AV VTI:            0.163 m AV Peak Grad:      6.0 mmHg AV Mean Grad:      4.0 mmHg LVOT Vmax:         75.80 cm/s LVOT Vmean:        59.000 cm/s LVOT VTI:          0.140 m LVOT/AV VTI ratio: 0.86  AORTA Ao Root diam: 2.80 cm  MITRAL VALVE MV Area (PHT): 4.29 cm     SHUNTS MV Decel Time: 177 msec     Systemic VTI:  0.14 m MV E velocity: 115.00 cm/s  Systemic Diam: 2.00 cm  Redell Shallow MD Electronically signed by Redell Shallow MD Signature Date/Time: 04/01/2024/3:35:00 PM    Final          ______________________________________________________________________________________________       Laboratory Data: High Sensitivity Troponin:  No results for input(s): TROPONINIHS in the last 720 hours.  Recent Labs  Lab 04/01/24 0928 04/01/24 1117  TRNPT 26* 26*      Chemistry Recent Labs  Lab 04/01/24 0928  NA 138  K 4.8  CL 103  CO2 25  GLUCOSE 113*  BUN 23  CREATININE 1.52*  CALCIUM  8.9  GFRNONAA 50*  ANIONGAP 10    No results for input(s): PROT, ALBUMIN, AST, ALT, ALKPHOS, BILITOT in the last 168 hours. Lipids No results for input(s): CHOL, TRIG, HDL, LABVLDL, LDLCALC, CHOLHDL in the last 168 hours.  Hematology Recent Labs  Lab 04/01/24 0928  WBC 8.6  RBC 6.21*  HGB 18.2*  HCT 54.2*  MCV 87.3  MCH 29.3  MCHC 33.6  RDW 16.7*  PLT 214   Thyroid No results for input(s): TSH, FREET4 in the last 168 hours.  BNP Recent Labs  Lab 04/01/24 1117  PROBNP 3,181.0*    DDimer No results for input(s): DDIMER in the last 168 hours.  Radiology/Studies:  DG Chest 2 View Result Date: 04/01/2024 EXAM: 2 VIEW(S) XRAY OF THE CHEST 04/01/2024 09:30:50 AM COMPARISON: None available. CLINICAL HISTORY: Back pain. FINDINGS: LUNGS AND  PLEURA: Mild pulmonary edema. Diffuse interstitial opacities with Kerley B-lines. No pleural effusion. No pneumothorax. HEART AND MEDIASTINUM: Cardiomegaly. BONES AND SOFT TISSUES: No acute osseous abnormality. IMPRESSION: 1. Mild pulmonary edema with diffuse interstitial opacities and Kerley B-lines. 2. Cardiomegaly. Electronically signed by: Evalene Coho MD 04/01/2024 09:37 AM EST RP Workstation: [id number]     Assessment and Plan:  Acute on chronic HFrEF  Ischemic cardiomyopathy  HTN  - Patient had STEMI in 01/2023. EF at that time  25-50%. EF stayed reduced after revascularization. Was offered ICD, but patient declined  - In the past, patient has been hesitant to start medications for heart failure due to possible side effects and cost. Has currently not been taking any medications aside from aspirin   - Now presents with DOE, orthopnea, abdominal distention, 8 lbs weight gain. ProBNP 3181  - Echo this admission showed EF 25-30%, grade III DD, normal RV systolic function, severe biatrial enlargement, mild MR  - Patient willing to take valsartan . Concerned about cost of entresto. He has medicare but does not have prescription coverage  - Start irbesartan  75 mg daily.  Discharge on valsartan  80 mg daily  - Continue IV lasix  40 mg BID- follow Strict I/Os, daily weights   CAD  - Patient has 3 overlapping DES in the LAD. Also has DES in the LCX-Om2, DES in prox-mid RCA  - In the past, Dr. Anner had recommended long-term DAPT given extensive stents with 3 overlapping stents in the LAD  - Patient has been taking ASA 81 mg daily. Has not been taking effient   - In the past, Brilinta  was cost prohibative.  - Start plavix  75 mg daily  - Has declined statins, repatha   Back Pain   -Patient tells me that he has been having back pain for 30 years. He use to ride dirt bikes and injured his back several times  - He has been having back pain between his shoulder blades for 4 weeks or so. Pain  feels like soreness. Relieved slightly by warm baths, stretching  - Prior to needing stents in the past patient also had back pain. However, he tells me that his current back pain does not remind him of what he felt prior to stenting  - hsTn 26>26. Echo with stable EF - Back pain reproducible on palpation, likely musculoskeletal.    Risk Assessment/Risk Scores:  [place]  Heart Association (NYHA) Functional Class NYHA Class II      For questions or updates, please contact [facility] Please consult [email or web address] for contact info under    Signed, Rollo FABIENE Louder, PA-C  04/01/2024 2:12 PM  I have seen and examined the patient along  with Rollo FABIENE Louder, PA-C .  I have reviewed the chart, notes and new data.  I agree with PA/NP's note.  Key new complaints: [place]  Was able to do that just 2 weeks ago.  Now has his stop to catch his breath while going uphill.  Seems to have NYHA functional class II symptoms.  Has not had chest pain that reminds him of his previous angina although he does have some back pain that sounds musculoskeletal.  Has not had dizziness, palpitations or syncope.  He has been reluctant to take any medicines and wanted to do it on his own.  He now realizes that he will need to take medications, but remains opposed to taking medications that are expensive (he does not have any drug coverage plan) or that can cause fatigue or erectile dysfunction. Key examination changes: Does not have jugular venous distention or peripheral edema.  Lungs are clear.  Regular rate and rhythm.  No significant murmurs, although he does have an aortic ejection murmur that is early peaking and faint.  S3 gallop present.  No pericardial rub. Key new findings / data: His echocardiogram does not show any change in left ventricular systolic function which remains severely depressed at 25-30% although  the left ventricle does appear slightly more dilated.  PLAN: We talked in detail about the multi pronged approach to treating congestive heart failure that usually involves 4 different classes of medications in addition to loop diuretics for symptom relief.  We talked about the pros and cons of each of these medicines.  At this point I do not think we should start beta-blockers, but will start valsartan , with an option to switch this to sacubitril -valsartan , if his blood pressure tolerates it and if he can afford it now that it is a generic drug.  We will also start spironolactone  in addition to daily loop diuretic.  He cannot afford Jardiance or Farxiga.  We were unable to obtain assistance for Repatha in the past he may not be able to obtain assistance for SGL T1 inhibitors either.  He will also take aspirin  and clopidogrel .  We do not yet have an option for lipid lowering that we will work due to his history of statin myopathy and the expense associated with PCSK9 inhibitors.  In summary will prescribe: Valsartan  160 mg once daily, spironolactone  12.5 mg daily, aspirin  81 mg daily, clopidogrel  75 mg daily and will switch to oral loop diuretics tomorrow.  When he returns to clinic we will have to negotiate starting a beta-blocker (suspect he will do better with a once daily agent such as metoprolol  succinate or bisoprolol rather than carvedilol), discussed transitioning to sacubitril/valsartan  and identify some way of treating his lipid disorder.  I would also revisit the purpose and indications for primary prevention ICD implantation, although he has previously expressed skepticism about this.  Jerel Balding, MD, [facility] [phone] 04/01/2024, 6:28 PM      [1]  Allergies Allergen Reactions   Simvastatin Other (See Comments)    Myalgias / myopathy   Banana Other (See Comments)    Causes cramping   "

## 2024-04-01 NOTE — ED Notes (Signed)
 Patient transported to X-ray

## 2024-04-02 ENCOUNTER — Other Ambulatory Visit (HOSPITAL_COMMUNITY): Payer: Self-pay

## 2024-04-02 DIAGNOSIS — D751 Secondary polycythemia: Secondary | ICD-10-CM | POA: Diagnosis present

## 2024-04-02 DIAGNOSIS — I5023 Acute on chronic systolic (congestive) heart failure: Secondary | ICD-10-CM | POA: Diagnosis not present

## 2024-04-02 LAB — BASIC METABOLIC PANEL WITH GFR
Anion gap: 12 (ref 5–15)
BUN: 23 mg/dL (ref 8–23)
CO2: 28 mmol/L (ref 22–32)
Calcium: 8.6 mg/dL — ABNORMAL LOW (ref 8.9–10.3)
Chloride: 100 mmol/L (ref 98–111)
Creatinine, Ser: 1.54 mg/dL — ABNORMAL HIGH (ref 0.61–1.24)
GFR, Estimated: 49 mL/min — ABNORMAL LOW
Glucose, Bld: 76 mg/dL (ref 70–99)
Potassium: 3.8 mmol/L (ref 3.5–5.1)
Sodium: 140 mmol/L (ref 135–145)

## 2024-04-02 LAB — CBC
HCT: 53.5 % — ABNORMAL HIGH (ref 39.0–52.0)
Hemoglobin: 18.5 g/dL — ABNORMAL HIGH (ref 13.0–17.0)
MCH: 29.5 pg (ref 26.0–34.0)
MCHC: 34.6 g/dL (ref 30.0–36.0)
MCV: 85.2 fL (ref 80.0–100.0)
Platelets: 239 K/uL (ref 150–400)
RBC: 6.28 MIL/uL — ABNORMAL HIGH (ref 4.22–5.81)
RDW: 16.4 % — ABNORMAL HIGH (ref 11.5–15.5)
WBC: 10.1 K/uL (ref 4.0–10.5)
nRBC: 0 % (ref 0.0–0.2)

## 2024-04-02 LAB — LACTIC ACID, PLASMA: Lactic Acid, Venous: 1.3 mmol/L (ref 0.5–1.9)

## 2024-04-02 MED ORDER — IRBESARTAN 300 MG PO TABS: 300.0000 mg | ORAL_TABLET | Freq: Every day | ORAL | Status: DC

## 2024-04-02 MED ORDER — VALSARTAN 160 MG PO TABS
160.0000 mg | ORAL_TABLET | Freq: Two times a day (BID) | ORAL | 0 refills | Status: AC
  Filled 2024-04-02: qty 60, 30d supply, fill #0

## 2024-04-02 MED ORDER — FUROSEMIDE 40 MG PO TABS
40.0000 mg | ORAL_TABLET | Freq: Every day | ORAL | 0 refills | Status: AC
  Filled 2024-04-02: qty 30, 30d supply, fill #0

## 2024-04-02 MED ORDER — CLOPIDOGREL BISULFATE 75 MG PO TABS
75.0000 mg | ORAL_TABLET | Freq: Every day | ORAL | 0 refills | Status: AC
  Filled 2024-04-02: qty 30, 30d supply, fill #0

## 2024-04-02 MED ORDER — IRBESARTAN 300 MG PO TABS: 150.0000 mg | ORAL_TABLET | Freq: Every day | ORAL | Status: DC

## 2024-04-02 MED ORDER — SPIRONOLACTONE 25 MG PO TABS
12.5000 mg | ORAL_TABLET | Freq: Every day | ORAL | 0 refills | Status: AC
  Filled 2024-04-02: qty 15, 30d supply, fill #0

## 2024-04-02 MED ORDER — FUROSEMIDE 40 MG PO TABS
40.0000 mg | ORAL_TABLET | Freq: Every day | ORAL | Status: DC
  Administered 2024-04-02: 40 mg via ORAL
  Filled 2024-04-02: qty 1

## 2024-04-02 NOTE — TOC CM/SW Note (Addendum)
 Transition of Care [facility]) - Inpatient Brief Assessment   Patient Details  Name: Brandon Foster MRN: [id number] Date of Birth: 1955/09/28  Transition of Care [facility]) CM/SW Contact:    Waddell Barnie Rama, RN Phone Number: 04/02/2024, 12:02 PM   Clinical Narrative: From home with spouse, has no PCP , but insurance on file,   he states his wife is a PA and she works [employer] in [employer] and she is working on getting him seen there, so she will get his follow up apt made,, This NCM set up a patient hospital follow up on AVS, states has no HH services in place at this time or DME at home.  States family member (wife)  will transport them home at [employer] and family is support system, states he is ok with [facility] pharmacy filling his medications prior to dc.  Pta self ambulatory.   There are no ICM needs identified  at this time.  Please place consult for ICM  needs.     Transition of Care Asessment: Insurance and Status: Insurance coverage has been reviewed Patient has primary care physician: Yes Home environment has been reviewed: home with wife Prior level of function:: indep Prior/Current Home Services: No current home services Social Drivers of Health Review: SDOH reviewed no interventions necessary Readmission risk has been reviewed: Yes Transition of care needs: no transition of care needs at this time

## 2024-04-02 NOTE — Evaluation (Signed)
 Occupational Therapy Evaluation & Discharge  Patient Details Name: Brandon Foster MRN: [id number] DOB: Nov 08, 1955 Today's Date: 04/02/2024   History of Present Illness   69 y.o. male [facility] with low back pain, DOE, orthopnea and edema. PMH includes HFrEF, ischemic cardiomyopathy, CAD s/p PCI, HLD, alcohol use disorder in remission.     Clinical Impressions Brandon Foster was evaluated s/p the above admission list. He is active and indep at baseline. Upon evaluation, pt demonstrated indep ability to complete mobility and ADLs. Pt does not require further acute, or follow up OT services. Recommend discharge back to pt's environment with assist as needed. OT to sign off with appreciation of order, please re-consult if needed.       If plan is discharge home, recommend the following:   Assistance with cooking/housework     Functional Status Assessment   Patient has had a recent decline in their functional status and demonstrates the ability to make significant improvements in function in a reasonable and predictable amount of time.     Equipment Recommendations   None recommended by OT     Recommendations for Other Services         Precautions/Restrictions   Precautions Precautions: None Recall of Precautions/Restrictions: Intact Restrictions Weight Bearing Restrictions Per Provider Order: No     Mobility Bed Mobility Overal bed mobility: Independent                  Transfers Overall transfer level: Independent                 General transfer comment: no SOB with hallway mobility      Balance Overall balance assessment: Independent                                         ADL either performed or assessed with clinical judgement   ADL Overall ADL's : Independent                                             Vision Baseline Vision/History: 0 No visual deficits Vision Assessment?: No  apparent visual deficits     Perception Perception: Within Functional Limits       Praxis Praxis: WFL       Pertinent Vitals/Pain Pain Assessment Pain Assessment: No/denies pain Pain Intervention(s): Monitored during session     Extremity/Trunk Assessment Upper Extremity Assessment Upper Extremity Assessment: Overall WFL for tasks assessed   Lower Extremity Assessment Lower Extremity Assessment: Overall WFL for tasks assessed   Cervical / Trunk Assessment Cervical / Trunk Assessment: Normal   Communication Communication Communication: No apparent difficulties   Cognition Arousal: Alert Behavior During Therapy: WFL for tasks assessed/performed Cognition: No apparent impairments             OT - Cognition Comments: agreeable to take meds now since his life style changes have not cured him                 Following commands: Intact       Cueing  General Comments   Cueing Techniques: Verbal cues  VSS   Exercises Exercises: Other exercises   Shoulder Instructions      Home Living Family/patient expects to be discharged to:: Private residence Living Arrangements:  Spouse/significant other Available Help at Discharge: Family;Available 24 hours/day Type of Home: House Home Access: Stairs to enter     Home Layout: Two level;Bed/bath upstairs Alternate Level Stairs-Number of Steps: flgiht   Bathroom Shower/Tub: Producer, Television/film/video: Standard     Home Equipment: None          Prior Functioning/Environment Prior Level of Function : Independent/Modified Independent             Mobility Comments: independent and active, works out multiple days a week ADLs Comments: indep, retired, enjoys hiking    OT Problem List: Cardiopulmonary status limiting activity   OT Treatment/Interventions:        OT Goals(Current goals can be found in the care plan section)   Acute Rehab OT Goals Patient Stated Goal: home OT Goal  Formulation: With patient Time For Goal Achievement: 04/02/24 Potential to Achieve Goals: Good   OT Frequency:       Co-evaluation              AM-PAC OT 6 Clicks Daily Activity     Outcome Measure Help from another person eating meals?: None Help from another person taking care of personal grooming?: None Help from another person toileting, which includes using toliet, bedpan, or urinal?: None Help from another person bathing (including washing, rinsing, drying)?: None Help from another person to put on and taking off regular upper body clothing?: None Help from another person to put on and taking off regular lower body clothing?: None 6 Click Score: 24   End of Session Equipment Utilized During Treatment: Rolling walker (2 wheels) Nurse Communication: Mobility status  Activity Tolerance: Patient tolerated treatment well Patient left: in chair;with call bell/phone within reach  OT Visit Diagnosis: Muscle weakness (generalized) (M62.81)                Time: 1000-1018 OT Time Calculation (min): 18 min Charges:  OT General Charges $OT Visit: 1 Visit OT Evaluation $OT Eval Low Complexity: 1 Low  Lucie Kendall, OTR/L Acute Rehabilitation Services Office [phone] Secure Chat Communication Preferred    Lucie JONETTA Kendall 04/02/2024, 12:14 PM

## 2024-04-02 NOTE — Discharge Summary (Cosign Needed)
 "  Name: Brandon Foster MRN: [id number] DOB: 1955/12/04 69 y.o. PCP: Patient, No Pcp Per  Date of Admission: 04/01/2024  8:48 AM Date of Discharge: 04/02/24 Attending Physician: Dr. Mliss Pouch  Discharge Diagnosis: 1. Principal Problem:   Acute on chronic heart failure with reduced ejection fraction (HFrEF, <= 40%) (HCC) Active Problems:   History of ST elevation myocardial infarction (STEMI) of inferior wall (HCC)   Hyperlipidemia with target low density lipoprotein (LDL) cholesterol less than 55 mg/dL   CAD S/P percutaneous coronary angioplasty   Ischemic cardiomyopathy   AKI (acute kidney injury)   Essential hypertension  Discharge Medications: Allergies as of 04/02/2024       Reactions   Simvastatin Other (See Comments)   Myalgias / myopathy   Banana Other (See Comments)   Causes cramping        Medication List     PAUSE taking these medications    testosterone  cypionate 100 MG/ML injection Wait to take this until your doctor or other care provider tells you to start again. Commonly known as: DEPOTESTOTERONE CYPIONATE Inject 50 mg into the muscle See admin instructions. Inject 50mg  into the muscle twice a week.       STOP taking these medications    Aspirin  Low Dose 81 MG chewable tablet Generic drug: aspirin        TAKE these medications    b complex vitamins capsule Take 1 capsule by mouth daily.   clopidogrel  75 MG tablet Commonly known as: PLAVIX  Take 1 tablet (75 mg total) by mouth daily. Start taking on: April 03, 2024   furosemide  40 MG tablet Commonly known as: LASIX  Take 1 tablet (40 mg total) by mouth daily. Start taking on: April 03, 2024   OMEGA 3 PO Take 1 capsule by mouth daily at 12 noon.   spironolactone  25 MG tablet Commonly known as: ALDACTONE  Take 0.5 tablets (12.5 mg total) by mouth daily. Start taking on: April 03, 2024   tadalafil  5 MG tablet Commonly known as: CIALIS  Take 5 mg by mouth daily.   valsartan  160  MG tablet Commonly known as: Diovan  Take 1 tablet (160 mg total) by mouth 2 (two) times daily.   VITAMIN C PO Take 1 tablet by mouth daily.   VITAMIN D PO Take 1 tablet by mouth daily.   VITAMIN E PO Take 1 capsule by mouth daily.       Disposition and follow-up:   [name] was discharged from [facility] in Good condition.  At the hospital follow up visit please address:  1.  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 Enlarged prostate -patient without any acute urinary symptoms during this hospitalization, though enlarged prostate was found on KUB ultrasound, could warrant further investigation  Thank you for allowing us  to be part of your care. You were hospitalized for acute heart failure exacerbation. We treated you with diuresis and medication optimization.  See the changes in your medications and management of your chronic conditions below:  *For your acute heart failure exacerbation -We have STARTED you on these following medications:  -Valsartan  160 mg twice daily -Spironolactone  12.5 mg daily -Furosemide  40 mg daily -Clopidogrel  75 mg daily -We have STOPPED these following medications:  - ASA 81mg  daily (clopidogrel  alone is sufficient)  FOLLOW UP APPOINTMENTS: We arranged for you to follow up  with your family doctor on 04/13/2024 at 9:30 AM, with Dr. Sherlynn Madden, MD   Please call your PCP or our clinic if you have any questions or concerns, we may be able to help and keep you from a long and expensive emergency room wait. Our clinic and after hours phone number is [phone]. The best time to call is Monday through Friday 9 am to 4 pm but there is always someone available 24/7 if you have an emergency. If you need medication refills please  notify your pharmacy one week in advance and they will send us  a request.   We are glad you are feeling better,  Brandon Light, DO Internal Medicine Inpatient Teaching Service at [facility]   Follow-up Appointments:  Follow-up Information     [facility] [place]. Go on 05/06/2024.   Contact information: [address] [place]  [address] [phone]        Sherlynn Madden, MD Follow up on 04/13/2024.   Specialty: Internal Medicine Why: 9:20 for hospital follow up Contact information: [address] [place] [address] [phone]         [facility] at [facility] Follow up.   Specialty: Rehabilitation Why: They will call you to set up apt times, if you do not hear from them in 3 business days, please give them a call. thank you Contact information: [address] [place]  [phone]        [facility]. Go on 04/07/2024.   Specialty: Cardiology Why: Hospital Follow-Up 04/07/24 @ 8:15 [facility] , Entrance C off of [address] Please Bring all medications to follow-up appointment with you. Free Valet Parking at the door or use Gate Code [id number] to park under the building. Contact information: [address] [place]  [phone]               Hospital Course by problem list: Brandon Foster is a 69 y.o. person living with a history of HFrEF 2/2 ICM, CAD s/p multivessel PCI, and hyperlipidemia intolerant to statins who presented with 2-3 weeks of back pain, dyspnea on exertion, orthopnea, and weight gain and is admitted for acute HFrEF exacerbation, now being discharged on hospital day 0 with the following pertinent hospital course:  Acute HFrEF exacerbation Ischemic cardiomyopathy CAD s/p multivessel  PCI Hyperlipidemia with LDL goal less than 55 Symptoms on presentation were overall mild to moderate but has a long history of reduced ejection fraction with previous recommendations for placement of ICD and he has been off all GDMT as well as lipid-lowering therapies and antiplatelets for several months.  Most recent echocardiogram from 03/31/2023 showed LVEF 33%, severely decreased LV function, global hypokinesis of the LV, normal RV function, severely dilated left atrium with intra-atrial septum bulge towards the right atrium.  He has significant multivessel coronary artery disease with prior PCI and as above has been off antiplatelets and lipid-lowering therapies.  No clear cardiac event uncovered that triggered his worsening symptoms but overall untreated coronary disease with ischemic cardiomyopathy is the most likely cause of his worsening heart failure.  Patient workup in the ED remarkable for elevated creatinine at 1.52, proBNP 3100, troponins x 2 of 26, no leukocytosis and hemoglobin of 18.2.  UA had small amount of proteins and ketones, chest x-ray with evidence of pulmonary edema and cardiomegaly, EKG sinus tach  with occasional PVCs and no signs of ischemia.  Patient was given IV Lasix  in the ED, and reevaluated by the admitting team in the afternoon.  IMTS ordered stat echocardiogram showing slightly reduced EF of 25 to 30%. We placed patient on continuous cardiac telemetry and consulted cardiology, who started patient on valsartan  160 mg daily, spironolactone  12.5 mg daily, aspirin  81 mg daily, and clopidogrel  75 mg daily.  Patient was given 3 more rounds of IV diuresis before switching to oral loop diuretics before discharge.  Patient tolerated this well, and had much improvement after diuresis.  Cardiology has already set up patient with outpatient follow-up appointment, and recommending patient continue valsartan  160 twice daily, spironolactone  12.5 mg daily, clopidogrel  75 daily, and furosemide  40  mg daily.  Cardiology says patient does not need to take daily aspirin , clopidogrel  monotherapy should be sufficient, and will discuss restarting beta-blockers at next visit.  Elevated blood pressure Chart review shows no definite diagnosis of hypertension and he had been intolerant to some GDMT with low blood pressures.  Patient's blood pressures were stable during and after diuresis, with systolics in the 130s, though continues to have elevated diastolics.  Patient is asymptomatic, can adjust further antihypertensives as outpatient.   AKI versus CKD Creatinine 1.5 up from baseline of 1.0, likely cardiorenal AKI but he has not been on any treatment for his CAD or heart failure for the past 6+ months so he could have had some progression of chronic kidney disease and he does have some protein in his urine currently.  Renal ultrasound without acute findings of kidney or bladder, though with some prostate gland enlargement.   Polycythemia Hemoglobin here is 18, likely 2/2 to IM testosterone .  Discussed discontinuing this while inpatient and having thorough discussion with PCP before resuming.  Discussed with patient that polycythemia may be leading to increased renal and his heart, patient voiced understanding, though is reluctant to stop this.   Subjective Patient was seen this morning on rounds, sitting up in bed, reportedly feeling better. A lot less water than yesterday, no longer lethargic. Thinks he's lost 6-7 lbs from peeing all the time. Got up to pee every 2-3 hours during the night. No chest pain, mainly pain in his back 1/10. Thinks his back is stiff but that is normal. He is open to medications but is worried about the side effects and understanding the mechanisms. States that he feels a bit sleepy with the new medicines but is otherwise close to his baseline. Denies any orthopnea, notes this wasn't an issue. He describes that he was breathing more, but never felt short of breath. Describing  tachypnea to catch up with his breath. Did experience dyspnea on exertion. Patient is excited and hopeful to return home today, and expressed his understanding from cardiology to continue to eat a low salt diet and take his prescribed medications.  Discharge Exam:   BP (!) 132/101 (BP Location: Right Arm)   Pulse 91   Temp 98.2 F (36.8 C) (Oral)   Resp 20   Ht 6' (1.829 m)   Wt 80.1 kg   SpO2 100%   BMI 23.95 kg/m  Discharge exam:  Constitutional: Well-appearing adult male. In no acute distress. Neck: no obvious JVD Cardio: Regular rate and rhythm. 2+ bilateral radial and dorsalis pedis pulses.  Bilateral upper and lower extremities warm well-perfused, normal capillary refill Pulm: mild rales in left lower lung base, normal work of breathing on room air. MSK: Negative for extremity edema. Skin:  Warm and dry, bright red skin over face, neck and extremities Neuro:Alert and oriented x3. No focal deficit noted. Psych:Pleasant mood and affect.  Pertinent Labs, Studies, and Procedures:     Latest Ref Rng & Units 04/02/2024    3:19 AM 04/01/2024    9:28 AM 02/04/2023    3:07 AM  CBC  WBC 4.0 - 10.5 K/uL 10.1  8.6  11.5   Hemoglobin 13.0 - 17.0 g/dL 81.4  81.7  83.8   Hematocrit 39.0 - 52.0 % 53.5  54.2  47.2   Platelets 150 - 400 K/uL 239  214  226        Latest Ref Rng & Units 04/02/2024    3:19 AM 04/01/2024    4:31 PM 04/01/2024    9:28 AM  CMP  Glucose 70 - 99 mg/dL 76   886   BUN 8 - 23 mg/dL 23   23   Creatinine 9.38 - 1.24 mg/dL 8.45   8.47   Sodium 864 - 145 mmol/L 140   138   Potassium 3.5 - 5.1 mmol/L 3.8   4.8   Chloride 98 - 111 mmol/L 100   103   CO2 22 - 32 mmol/L 28   25   Calcium  8.9 - 10.3 mg/dL 8.6   8.9   Total Protein 6.5 - 8.1 g/dL  6.5    Total Bilirubin 0.0 - 1.2 mg/dL  1.0    Alkaline Phos 38 - 126 U/L  53    AST 15 - 41 U/L  67    ALT 0 - 44 U/L  89      US  RENAL Result Date: 04/01/2024 EXAM: RETROPERITONEAL ULTRASOUND OF THE KIDNEYS 04/01/2024  04:24:55 PM TECHNIQUE: Real-time ultrasonography of the retroperitoneum, specifically the kidneys and urinary bladder, was performed. COMPARISON: None available. CLINICAL HISTORY: AKI (acute kidney injury). FINDINGS: RIGHT KIDNEY: Right kidney measures 11.2 x 5.7 x 6.6 cm with a volume of 222.8 cc. Normal cortical echogenicity. No hydronephrosis. No calculus. No mass. LEFT KIDNEY: Left kidney measures 10.0 x 5.8 x 5.4 cm with a volume of 164.5 cc. Normal cortical echogenicity. No hydronephrosis. No calculus. No mass. BLADDER: Unremarkable appearance of the bladder. Prostate gland enlargement measuring 5.3x5.7 by 6.7 cm with a volume of 107.4 cc IMPRESSION: 1. No significant abnormality of the kidneys or urinary bladder . 2. Prostate gland enlargement. Electronically signed by: Waddell Calk MD 04/01/2024 04:32 PM EST RP Workstation: [id number]   ECHOCARDIOGRAM COMPLETE Result Date: 04/01/2024    ECHOCARDIOGRAM REPORT   Patient Name:   Brandon Foster Date of Exam: 04/01/2024 Medical Rec #:  [id number]   Height:       72.0 in Accession #:    [id number]  Weight:       184.0 lb Date of Birth:  15-Jul-1955   BSA:          2.056 m Patient Age:    68 years    BP:           134/100 mmHg Patient Gender: M           HR:           107 bpm. Exam Location:  Inpatient Procedure: 2D Echo, Cardiac Doppler, Color Doppler and Intracardiac            Opacification Agent (Both Spectral and Color Flow Doppler were            utilized during procedure). Indications:    Congestive Heart Failure I50.9  History:        Patient has prior history of Echocardiogram examinations, most                 recent 05/26/2023.  Sonographer:    Jayson Gaskins Referring Phys: [phone] ANTHONY ALLEN IMPRESSIONS  1. Left ventricular ejection fraction, by estimation, is 25 to 30%. The left ventricle has severely decreased function. The left ventricle demonstrates global hypokinesis. The left ventricular internal cavity size was moderately dilated. Left ventricular  diastolic parameters are consistent with Grade III diastolic dysfunction (restrictive).  2. Right ventricular systolic function is normal. The right ventricular size is moderately enlarged.  3. Left atrial size was severely dilated.  4. Right atrial size was severely dilated.  5. The mitral valve is normal in structure. Mild mitral valve regurgitation. No evidence of mitral stenosis.  6. The aortic valve is tricuspid. Aortic valve regurgitation is trivial. Aortic valve sclerosis is present, with no evidence of aortic valve stenosis.  7. The inferior vena cava is dilated in size with >50% respiratory variability, suggesting right atrial pressure of 8 mmHg. FINDINGS  Left Ventricle: Left ventricular ejection fraction, by estimation, is 25 to 30%. The left ventricle has severely decreased function. The left ventricle demonstrates global hypokinesis. The left ventricular internal cavity size was moderately dilated. There is no left ventricular hypertrophy. Left ventricular diastolic parameters are consistent with Grade III diastolic dysfunction (restrictive). Right Ventricle: The right ventricular size is moderately enlarged. Right ventricular systolic function is normal. Left Atrium: Left atrial size was severely dilated. Right Atrium: Right atrial size was severely dilated. Pericardium: There is no evidence of pericardial effusion. Mitral Valve: The mitral valve is normal in structure. Mild mitral valve regurgitation. No evidence of mitral valve stenosis. Tricuspid Valve: The tricuspid valve is normal in structure. Tricuspid valve regurgitation is trivial. No evidence of tricuspid stenosis. Aortic Valve: The aortic valve is tricuspid. Aortic valve regurgitation is trivial. Aortic valve sclerosis is present, with no evidence of aortic valve stenosis. Aortic valve mean gradient measures 4.0 mmHg. Aortic valve peak gradient measures 6.0 mmHg. Aortic valve area, by VTI measures 2.70 cm. Pulmonic Valve: The pulmonic valve  was normal in structure. Pulmonic valve regurgitation is not visualized. No evidence of pulmonic stenosis. Aorta: The aortic root is normal in size and structure. Venous: The inferior vena cava is dilated in size with greater than 50% respiratory variability, suggesting right atrial pressure of 8 mmHg. IAS/Shunts: No atrial level shunt detected by color flow Doppler.  LEFT VENTRICLE PLAX 2D LVIDd:         6.20 cm   Diastology LVIDs:         6.00 cm   LV e' medial:   5.33 cm/s LV PW:         1.00 cm   LV E/e' medial: 21.6 LV IVS:        0.60 cm LVOT diam:     2.00 cm LV SV:         44 LV SV Index:   21 LVOT Area:     3.14 cm  RIGHT VENTRICLE RV S prime:     13.70 cm/s TAPSE (M-mode): 2.0 cm LEFT ATRIUM             Index        RIGHT ATRIUM           Index LA Vol (A2C):   54.8 ml 26.65 ml/m  RA Area:     26.70 cm LA Vol (  A4C):   82.7 ml 40.22 ml/m  RA Volume:   97.80 ml  47.56 ml/m LA Biplane Vol: 68.4 ml 33.26 ml/m  AORTIC VALVE AV Area (Vmax):    1.95 cm AV Area (Vmean):   2.02 cm AV Area (VTI):     2.70 cm AV Vmax:           122.00 cm/s AV Vmean:          91.800 cm/s AV VTI:            0.163 m AV Peak Grad:      6.0 mmHg AV Mean Grad:      4.0 mmHg LVOT Vmax:         75.80 cm/s LVOT Vmean:        59.000 cm/s LVOT VTI:          0.140 m LVOT/AV VTI ratio: 0.86  AORTA Ao Root diam: 2.80 cm MITRAL VALVE MV Area (PHT): 4.29 cm     SHUNTS MV Decel Time: 177 msec     Systemic VTI:  0.14 m MV E velocity: 115.00 cm/s  Systemic Diam: 2.00 cm Redell Shallow MD Electronically signed by Redell Shallow MD Signature Date/Time: 04/01/2024/3:35:00 PM    Final    DG Chest 2 View Result Date: 04/01/2024 EXAM: 2 VIEW(S) XRAY OF THE CHEST 04/01/2024 09:30:50 AM COMPARISON: None available. CLINICAL HISTORY: Back pain. FINDINGS: LUNGS AND PLEURA: Mild pulmonary edema. Diffuse interstitial opacities with Kerley B-lines. No pleural effusion. No pneumothorax. HEART AND MEDIASTINUM: Cardiomegaly. BONES AND SOFT TISSUES: No acute  osseous abnormality. IMPRESSION: 1. Mild pulmonary edema with diffuse interstitial opacities and Kerley B-lines. 2. Cardiomegaly. Electronically signed by: Evalene Coho MD 04/01/2024 09:37 AM EST RP Workstation: [id number]   SignedBETHA Idelle Nakai, DO 04/02/2024, 1:13 PM   "

## 2024-04-02 NOTE — Care Management Obs Status (Signed)
 MEDICARE OBSERVATION STATUS NOTIFICATION   Patient Details  Name: Brandon Foster MRN: [id number] Date of Birth: [birth year]   Medicare Observation Status Notification Given:  Yes    [name] Sharps 04/02/2024, 8:08 AM

## 2024-04-02 NOTE — Progress Notes (Signed)
 Heart Failure Nurse Navigator Progress Note  PCP: Patient, No Pcp Per PCP-Cardiologist: Alm Clay, MD Admission Diagnosis: None Admitted from: Home  Presentation:   Brandon Foster is a 69 y.o. male.  He presented with mid upper back pain between shoulder blades for the past 2-3 weeks.  Has history of MI.  No fever but some congestion. No severe dyspnea with exertion.  HS-Troponin- 26. Pro-BNP 3,181. Chest x-ray:Mild pulmonary edema. Diffuse interstitial opacities with Kerley B-lines.  No pleural effusion. No pneumothorax.  Cardiomegaly.  ECHO/ LVEF: 25-30% Grade III Diastolic Dysfunction (restrictive)  Clinical Course:  Past Medical History:  Diagnosis Date   Alcohol abuse    Depression    History of chickenpox      Social History   Socioeconomic History   Marital status: Married    Spouse name: Not on file   Number of children: 1   Years of education: Not on file   Highest education level: Associate degree: academic program  Occupational History   Occupation: Retired  Tobacco Use   Smoking status: Former   Smokeless tobacco: Never  Advertising Account Planner   Vaping status: Never Used  Substance and Sexual Activity   Alcohol use: Yes    Alcohol/week: 4.0 standard drinks of alcohol    Types: 4 Cans of beer per week    Comment: 2-3 beers per week   Drug use: No   Sexual activity: Not on file  Other Topics Concern   Not on file  Social History Narrative   Not on file   Social Drivers of Health   Tobacco Use: Medium Risk (04/01/2024)   Patient History    Smoking Tobacco Use: Former    Smokeless Tobacco Use: Never    Passive Exposure: Not on file  Financial Resource Strain: Medium Risk (02/03/2023)   Overall Financial Resource Strain (CARDIA)    Difficulty of Paying Living Expenses: Somewhat hard  Food Insecurity: No Food Insecurity (02/01/2023)   Hunger Vital Sign    Worried About Running Out of Food in the Last Year: Never true    Ran Out of Food in the Last Year: Never  true  Transportation Needs: No Transportation Needs (02/01/2023)   PRAPARE - Administrator, Civil Service (Medical): No    Lack of Transportation (Non-Medical): No  Physical Activity: Not on file  Stress: Not on file  Social Connections: Not on file  Depression (EYV7-0): Not on file  Alcohol Screen: Low Risk (02/03/2023)   Alcohol Screen    Last Alcohol Screening Score (AUDIT): 0  Housing: Low Risk (02/03/2023)   Housing    Last Housing Risk Score: 0  Utilities: Not At Risk (02/01/2023)   AHC Utilities    Threatened with loss of utilities: No  Health Literacy: Not on file   Education Assessment and Provision:  Detailed education and instructions provided on heart failure disease management including the following:  Signs and symptoms of Heart Failure When to call the physician Importance of daily weights Low sodium diet Fluid restriction Medication management Anticipated future follow-up appointments  Patient education given on each of the above topics.  Patient acknowledges understanding via teach back method and acceptance of all instructions.  Education Materials:  Living Better With Heart Failure Booklet, HF zone tool, & Daily Weight Tracker Tool.  Patient has scale at home: Yes Patient has pill box at home: Yes    High Risk Criteria for Readmission and/or Poor Patient Outcomes: Heart failure hospital admissions (last 6 months):  1  No Show rate: 0% Difficult social situation: None determined at this time. Demonstrates medication adherence: Yes Primary Language: English Literacy level: Reading, Writing & Comprehension  Barriers of Care:   Daily Weights Diet & Fluid Restrictions  Considerations/Referrals:  Referral made to Heart Failure Pharmacist Stewardship: Yes Referral made to Heart Failure CSW/NCM TOC: No Referral made to Heart & Vascular TOC clinic: [facility] 04/07/24 @ 8:15 AM  Items for Follow-up on DC/TOC: Daily  Weights Diet & Fluid Restrictions Continued Heart Failure Education  Charmaine Pines, RN, BSN [facility] Heart Failure Navigator Secure Chat Only

## 2024-04-02 NOTE — Progress Notes (Signed)
 "  Heart Failure Stewardship Pharmacist Progress Note   PCP: Patient, No Pcp Per PCP-Cardiologist: Alm Clay, MD    HPI:  69 yo M with PMH of HFrEF, ICM, CAD, and HLD.   CHF dates back to 01/2023 when he was admitted for STEMI and EF was reduced to 25-30%. S/p PCI to RCA and staged PCI to circumflex. Started on metoprolol  XL 25 mg daily. GDMT limited by low BP. He was seen in HF [facility] clinic on 02/11/23 and was not interested in starting new medications at that time. EF remained low on ECHO 05/26/23 and continued to decline new medications at that time.   Presented to the ED on 1/15 with shortness of breath, orthopnea, weight gain, congestion, loss of appetite, and back pain. Has not been taking any medications other than supplements, testosterone , and aspirin  for 6+ months. CXR with pulmonary edema and cardiomegaly. proBNP 3181. EKG showed sinus tachycardia with occasional PVC without signs of acute ischemia. Started on IV lasix . ECHO 1/15 with LVEF 25-30%, global hypokinesis, G3DD, RV normal.   Denies shortness of breath, lightheadedness, dizziness, or chest pain. No LE edema on exam. Discussed HF GDMT and concerns for adding therapy. He seems to be more willing now to start taking medications for this as his EF has not improved. He is more resistant to taking medications that contribute to fatigue. Agreeable to using [facility] TOC pharmacy at discharge.   Current HF Medications: Diuretic: furosemide  40 mg PO daily ACE/ARB/ARNI: irbesartan  150 mg daily MRA: spironolactone  12.5 mg daily  Prior to admission HF Medications: None  Pertinent Lab Values: Serum creatinine 1.54, BUN 23, Potassium 3.8, Sodium 140, proBNP 3181, Magnesium 2.2, A1c 5.5   Vital Signs: Weight: 176 lbs (admission weight: 180 lbs) Blood pressure: 90/50s  Heart rate: 90s  I/O: net --1.8L yesterday; net -3.1L since admission  Medication Assistance / Insurance Benefits Check: Does the patient have prescription insurance?   No Type of insurance plan: Only Medicare A/B  Does the patient qualify for medication assistance through manufacturers or grants?   Pending Eligible grants and/or patient assistance programs: pending Medication assistance applications in progress: none  Medication assistance applications approved: none Approved medication assistance renewals will be completed by: pending  Outpatient Pharmacy:  Prior to admission outpatient pharmacy: [facility] Is the patient willing to use [facility] TOC pharmacy at discharge? Yes Is the patient willing to transition their outpatient pharmacy to utilize a [facility] outpatient pharmacy?   No    Assessment: 1. Acute on chronic systolic CHF (LVEF 25-30%), due to ICM. NYHA class II symptoms. - Continue furosemide  40 mg daily. Strict I/Os and daily weights. Keep K>4 and Mg>2. - Consider starting metoprolol  XL 25 mg daily as outpatient - he is hesitant about starting new meds at this time - Remains on irbesartan  150 mg daily, hypotensive overnight but BP is coming back up this morning - Continue spironolactone  12.5 mg daily - Consider adding Jardiance 10 mg daily prior to discharge vs as outpatient (preferred SGLT2i with no prescription insurance as we can get patient assistance for this)    Plan: 1) Medication changes recommended at this time: - Add metoprolol  XL 25 mg daily at follow up - Add Jardiance 10 mg daily  2) Patient assistance: - Only has Medicare A/B - Would need patient assistance if starting Jardiance   3)  Education  - Patient has been educated on current HF medications and potential additions to HF medication regimen - Patient verbalizes understanding  that over the next few months, these medication doses may change and more medications may be added to optimize HF regimen - Patient has been educated on basic disease state pathophysiology and goals of therapy   Duwaine Plant, PharmD, BCPS Heart Failure Stewardship Pharmacist Phone [phone]   "

## 2024-04-02 NOTE — Plan of Care (Signed)
   Problem: Education: Goal: Knowledge of General Education information will improve Description: Including pain rating scale, medication(s)/side effects and non-pharmacologic comfort measures Outcome: Progressing   Problem: Health Behavior/Discharge Planning: Goal: Ability to manage health-related needs will improve Outcome: Progressing   Problem: Clinical Measurements: Goal: Cardiovascular complication will be avoided Outcome: Progressing

## 2024-04-02 NOTE — Hospital Course (Addendum)
 Acute HFrEF exacerbation Ischemic cardiomyopathy CAD s/p multivessel PCI Hyperlipidemia with LDL goal less than 55 Symptoms on presentation were overall mild to moderate but has a long history of reduced ejection fraction with previous recommendations for placement of ICD and he has been off all GDMT as well as lipid-lowering therapies and antiplatelets for several months.  Most recent echocardiogram from 03/31/2023 showed LVEF 33%, severely decreased LV function, global hypokinesis of the LV, normal RV function, severely dilated left atrium with intra-atrial septum bulge towards the right atrium.  He has significant multivessel coronary artery disease with prior PCI and as above has been off antiplatelets and lipid-lowering therapies.  No clear cardiac event uncovered that triggered his worsening symptoms but overall untreated coronary disease with ischemic cardiomyopathy is the most likely cause of his worsening heart failure.  Patient workup in the ED remarkable for elevated creatinine at 1.52, proBNP 3100, troponins x 2 of 26, no leukocytosis and hemoglobin of 18.2.  UA had small amount of proteins and ketones, chest x-ray with evidence of pulmonary edema and cardiomegaly, EKG sinus tach with occasional PVCs and no signs of ischemia.  Patient was given IV Lasix  in the ED, and reevaluated by the admitting team in the afternoon.  IMTS ordered stat echocardiogram showing slightly reduced EF of 25 to 30%. We placed patient on continuous cardiac telemetry and consulted cardiology, who started patient on valsartan  160 mg daily, spironolactone  12.5 mg daily, aspirin  81 mg daily, and clopidogrel  75 mg daily.  Patient was given 3 more rounds of IV diuresis before switching to oral loop diuretics before discharge.  Patient tolerated this well, and had much improvement after diuresis.  Cardiology has already set up patient with outpatient follow-up appointment, and recommending patient continue valsartan  160 twice  daily, spironolactone  12.5 mg daily, clopidogrel  75 daily, and furosemide  40 mg daily.  Cardiology says patient does not need to take daily aspirin , clopidogrel  monotherapy should be sufficient, and will discuss restarting beta-blockers at next visit.  Elevated blood pressure Chart review shows no definite diagnosis of hypertension and he had been intolerant to some GDMT with low blood pressures.  Patient's blood pressures were stable during and after diuresis, with systolics in the 130s, though continues to have elevated diastolics.  Patient is asymptomatic, can adjust further antihypertensives as outpatient.   AKI versus CKD Creatinine 1.5 up from baseline of 1.0, likely cardiorenal AKI but he has not been on any treatment for his CAD or heart failure for the past 6+ months so he could have had some progression of chronic kidney disease and he does have some protein in his urine currently.  Renal ultrasound without acute findings of kidney or bladder, though with some prostate gland enlargement.   Polycythemia Hemoglobin here is 18, likely 2/2 to IM testosterone .  Discussed discontinuing this while inpatient and having thorough discussion with PCP before resuming.  Discussed with patient that polycythemia may be leading to increased renal and his heart, patient voiced understanding, though is reluctant to stop this.

## 2024-04-02 NOTE — Progress Notes (Signed)
"  °  Progress Note  Patient Name: Brandon Foster Date of Encounter: 04/02/2024 [facility] Cardiologist: Alm Clay, MD   Interval Summary   Excellent diuresis. BP low overnight, but diastolic quite high this AM.  Vital Signs Vitals:   04/01/24 1900 04/01/24 2300 04/02/24 0300 04/02/24 0755  BP: 119/66 (!) 93/59 (!) 94/51 (!) 132/101  Pulse: 90 84 83 91  Resp: 14 16 17 20   Temp: 98.8 F (37.1 C) 97.7 F (36.5 C) 98 F (36.7 C) 98.2 F (36.8 C)  TempSrc: Oral Oral Oral Oral  SpO2: 94% 96% 96% 100%  Weight:   80.1 kg   Height:   6' (1.829 m)     Intake/Output Summary (Last 24 hours) at 04/02/2024 0947 Last data filed at 04/02/2024 0830 Gross per 24 hour  Intake 360 ml  Output 3450 ml  Net -3090 ml      04/02/2024    3:00 AM 04/01/2024    6:36 PM 04/01/2024    9:05 AM  Last 3 Weights  Weight (lbs) 176 lb 9.4 oz 180 lb 1.6 oz 184 lb  Weight (kg) 80.1 kg 81.693 kg 83.462 kg      Telemetry/ECG  NSR - Personally Reviewed  Physical Exam  GEN: No acute distress.   Neck: No JVD Cardiac: RRR, no murmurs, rubs, or gallops.  Respiratory: Clear to auscultation bilaterally. GI: Soft, nontender, non-distended  MS: No edema  Assessment & Plan  Appears euvolemic. Creatinine unchanged despite marked diuresis (creatinine 1.0 a year ago, but 1.5 on admission). Elevated DBP. He remains cautious about meds, although less skeptical. Increase ARB, watch for a couple of hours after administration to make sure he is not hypotensive, but will plan additional titration as an outpatient. At DC, rather than irbesartan , prefer twice daily valsartan  (160 mg twice daily) as our goal is to eventually transition to Entresto. Premature to start beta blockers and these are the meds that are most likely to cause the side effects he wants to avoid (fatigue, ED).  Unable to afford SGLTinh - will work on getting assistance at the office visit. Similar gap in treating hyperlipidemia due to statin  myopathy and unable to afford PCSK9inh. I think clopidogrel  monotherapy is sufficient, can skip ASA to lessen pill burden.  If no problems with hypotension, OK to DC later today  [facility] will sign off.   The patient is ready for discharge today from a cardiac standpoint. Medication Recommendations:    Valsartan  160 mg twice daily Spironolactone  12.5 mg daily Furosemide  40 mg daily Clopidogrel  75 mg daily   Other recommendations (labs, testing, etc):  discussed sodium restricted diet and judicious water intake in detail, importance of daily weight monitoring.  Asked him to bring daily weight and resting blood pressure logs to his office appointment.  Reviewed signs and symptoms of heart failure exacerbation as well as those of hypovolemia due to excessive diuresis. Follow up as an outpatient: Has an appointment already scheduled 05/06/2024.  Plan to check a basic metabolic panel at that time.  For questions or updates, please contact [facility] Please consult [email or web address] for contact info under         Signed, Jerel Balding, MD   "

## 2024-04-02 NOTE — Discharge Instructions (Addendum)
 Thank you for allowing us  to be part of your care. You were hospitalized for acute heart failure exacerbation. We treated you with diuresis and medication optimization.  See the changes in your medications and management of your chronic conditions below:  *For your acute heart failure exacerbation -We have STARTED you on these following medications:  -Valsartan  160 mg twice daily -Spironolactone  12.5 mg daily -Furosemide  40 mg daily -Clopidogrel  75 mg daily  FOLLOW UP APPOINTMENTS: We arranged for you to follow up with your family doctor on 04/13/2024 at 9:30 AM, with Dr. Sherlynn Madden, MD   Please call your PCP or our clinic if you have any questions or concerns, we may be able to help and keep you from a long and expensive emergency room wait. Our clinic and after hours phone number is [phone]. The best time to call is Monday through Friday 9 am to 4 pm but there is always someone available 24/7 if you have an emergency. If you need medication refills please notify your pharmacy one week in advance and they will send us  a request.   We are glad you are feeling better,  Alfornia Light, DO Internal Medicine Inpatient Teaching Service at [facility]

## 2024-04-02 NOTE — Progress Notes (Signed)
 DISCHARGE NOTE HOME Halsey Hammen to be discharged Home per MD order. Discussed prescriptions and follow up appointments with the patient. Prescriptions given to patient; medication list explained in detail. Patient verbalized understanding.  Skin clean, dry and intact without evidence of skin break down, no evidence of skin tears noted. IV catheter discontinued intact. Site without signs and symptoms of complications. Dressing and pressure applied. Pt denies pain at the site currently. No complaints noted.  Patient free of lines, drains, and wounds.   An After Visit Summary (AVS) was printed and given to the patient. Patient escorted via wheelchair, and discharged home via private auto.  Charnika Herbst K Stacey Maura, RN

## 2024-04-02 NOTE — Evaluation (Signed)
 Physical Therapy Evaluation Patient Details Name: Brandon Foster MRN: [id number] DOB: 1955/08/20 Today's Date: 04/02/2024  History of Present Illness  69 y.o. male presents to [facility] hospital on 04/01/2024 with low back pain, DOE, orthopnea and edema. PMH includes HFrEF, ischemic cardiomyopathy, CAD s/p PCI, HLD, alcohol use disorder in remission.  Clinical Impression  Pt presents to PT with acute on chronic back pain. Pt is mobilizing independently and expresses no concerns about functional mobility at this time. PT obtains a history regarding pt's history of back/neck pain along with current exercise regiment. PT provides education regarding alteration to the pt's home exercise program to reduce tension in upper trap area along with trigger point release utilizing passive pressure from a lacrosse ball or similar object. PT recommends ouptatient PT referral for further assessment and management of chronic back/neck pain.         If plan is discharge home, recommend the following:     Can travel by private vehicle        Equipment Recommendations None recommended by PT  Recommendations for Other Services       Functional Status Assessment Patient has not had a recent decline in their functional status     Precautions / Restrictions Precautions Precautions: None Recall of Precautions/Restrictions: Intact Restrictions Weight Bearing Restrictions Per Provider Order: No      Mobility  Bed Mobility Overal bed mobility:  (functional mobility assessment deferred as pt is mobilizing independently)                  Transfers                        Ambulation/Gait                  Stairs            Wheelchair Mobility     Tilt Bed    Modified Rankin (Stroke Patients Only)       Balance                                             Pertinent Vitals/Pain Pain Assessment Pain Assessment: 0-10 Pain Score: 3  Pain Location: upper  thoracic and lower cervical spine Pain Descriptors / Indicators: Aching Pain Intervention(s): Monitored during session    Home Living Family/patient expects to be discharged to:: Private residence Living Arrangements: Spouse/significant other Available Help at Discharge: Family;Available 24 hours/day Type of Home: House Home Access: Stairs to enter     Alternate Level Stairs-Number of Steps: flgiht Home Layout: Two level;Bed/bath upstairs Home Equipment: None      Prior Function Prior Level of Function : Independent/Modified Independent             Mobility Comments: independent and active, works out multiple days a week       Extremity/Trunk Assessment   Upper Extremity Assessment Upper Extremity Assessment: Overall WFL for tasks assessed    Lower Extremity Assessment Lower Extremity Assessment: Overall WFL for tasks assessed    Cervical / Trunk Assessment Cervical / Trunk Assessment: Normal  Communication   Communication Communication: No apparent difficulties    Cognition Arousal: Alert Behavior During Therapy: WFL for tasks assessed/performed   PT - Cognitive impairments: No apparent impairments  Following commands: Intact       Cueing Cueing Techniques: Verbal cues     General Comments General comments (skin integrity, edema, etc.): VSS on RA, pt in NAD    Exercises Other Exercises Other Exercises: PT provides education on alteration to pull down exercise to better isolate lower trapezius muscle. Other Exercises: PT provides education on alteration to upright row exercise instead with resisted shoulder shrug to place less tension on rotator cuff and continue to strengthen trapezius muscle Other Exercises: PT provides education on utilization of ball for trigger point release in supine for tight and painful areas in upper back or neck   Assessment/Plan    PT Assessment All further PT needs can be met in the next  venue of care  PT Problem List Pain       PT Treatment Interventions      PT Goals (Current goals can be found in the Care Plan section)       Frequency       Co-evaluation               AM-PAC PT 6 Clicks Mobility  Outcome Measure Help needed turning from your back to your side while in a flat bed without using bedrails?: None Help needed moving from lying on your back to sitting on the side of a flat bed without using bedrails?: None Help needed moving to and from a bed to a chair (including a wheelchair)?: None Help needed standing up from a chair using your arms (e.g., wheelchair or bedside chair)?: None Help needed to walk in hospital room?: None Help needed climbing 3-5 steps with a railing? : None 6 Click Score: 24    End of Session   Activity Tolerance: Patient tolerated treatment well Patient left: in chair;with call bell/phone within reach Nurse Communication: Mobility status PT Visit Diagnosis: Pain Pain - part of body:  (neck)    Time: 8894-8868 PT Time Calculation (min) (ACUTE ONLY): 26 min   Charges:   PT Evaluation $PT Eval Low Complexity: 1 Low   PT General Charges $$ ACUTE PT VISIT: 1 Visit         Bernardino JINNY Ruth, PT, DPT Acute Rehabilitation Office [phone]   Bernardino JINNY Ruth 04/02/2024, 11:38 AM

## 2024-04-05 ENCOUNTER — Telehealth: Payer: Self-pay

## 2024-04-05 NOTE — Telephone Encounter (Signed)
 Copied from CRM #[id number]. Topic: Appointments - Transfer of Care >> Apr 05, 2024  9:16 AM Antony RAMAN wrote: Pt is requesting to transfer FROM: none Pt is requesting to transfer TO: amber howard Reason for requested transfer: pt request It is the responsibility of the team the patient would like to transfer to (Dr. kayla) to reach out to the patient if for any reason this transfer is not acceptable.

## 2024-04-07 ENCOUNTER — Ambulatory Visit (HOSPITAL_COMMUNITY)

## 2024-04-13 ENCOUNTER — Ambulatory Visit: Admitting: Sports Medicine

## 2024-05-06 ENCOUNTER — Ambulatory Visit: Admitting: Physician Assistant

## 2024-07-28 ENCOUNTER — Ambulatory Visit: Admitting: Family Medicine
